# Patient Record
Sex: Female | Born: 2000 | Race: Black or African American | Hispanic: No | Marital: Single | Smoking: Never smoker
Health system: Southern US, Community
[De-identification: ages and names within clinical notes are randomized; demographics above are authoritative.]

## PROBLEM LIST (undated history)

## (undated) DIAGNOSIS — J302 Other seasonal allergic rhinitis: Secondary | ICD-10-CM

## (undated) HISTORY — PX: TONSILLECTOMY: SUR1361

## (undated) HISTORY — PX: WISDOM TOOTH EXTRACTION: SHX21

---
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
## 2022-01-04 ENCOUNTER — Emergency Department (HOSPITAL_BASED_OUTPATIENT_CLINIC_OR_DEPARTMENT_OTHER)
Admission: EM | Admit: 2022-01-04 | Discharge: 2022-01-04 | Disposition: A | Discharge status: Home / Self Care | Payer: No Typology Code available for payment source | Attending: Emergency Medicine | Admitting: Emergency Medicine

## 2022-01-04 ENCOUNTER — Encounter (HOSPITAL_BASED_OUTPATIENT_CLINIC_OR_DEPARTMENT_OTHER): Payer: Self-pay | Admitting: Urology

## 2022-01-04 ENCOUNTER — Emergency Department (HOSPITAL_BASED_OUTPATIENT_CLINIC_OR_DEPARTMENT_OTHER): Payer: No Typology Code available for payment source

## 2022-01-04 ENCOUNTER — Other Ambulatory Visit: Payer: Self-pay

## 2022-01-04 DIAGNOSIS — Y92410 Unspecified street and highway as the place of occurrence of the external cause: Secondary | ICD-10-CM | POA: Insufficient documentation

## 2022-01-04 DIAGNOSIS — M25561 Pain in right knee: Secondary | ICD-10-CM | POA: Diagnosis present

## 2022-01-04 DIAGNOSIS — M25562 Pain in left knee: Secondary | ICD-10-CM | POA: Insufficient documentation

## 2022-01-04 DIAGNOSIS — M545 Low back pain, unspecified: Secondary | ICD-10-CM | POA: Insufficient documentation

## 2022-01-04 LAB — PREGNANCY, URINE: Preg Test, Ur: NEGATIVE

## 2022-01-04 MED ORDER — NAPROXEN 500 MG PO TABS: 500.0000 mg | ORAL_TABLET | Freq: Two times a day (BID) | ORAL | 0 refills | Status: DC
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
## 2022-07-14 ENCOUNTER — Emergency Department (HOSPITAL_BASED_OUTPATIENT_CLINIC_OR_DEPARTMENT_OTHER): Payer: Medicaid Other

## 2022-07-14 ENCOUNTER — Other Ambulatory Visit: Payer: Self-pay

## 2022-07-14 ENCOUNTER — Encounter (HOSPITAL_BASED_OUTPATIENT_CLINIC_OR_DEPARTMENT_OTHER): Payer: Self-pay | Admitting: Emergency Medicine

## 2022-07-14 ENCOUNTER — Emergency Department (HOSPITAL_BASED_OUTPATIENT_CLINIC_OR_DEPARTMENT_OTHER)
Admission: EM | Admit: 2022-07-14 | Discharge: 2022-07-14 | Disposition: A | Discharge status: Home / Self Care | Payer: Medicaid Other | Attending: Emergency Medicine | Admitting: Emergency Medicine

## 2022-07-14 DIAGNOSIS — R202 Paresthesia of skin: Secondary | ICD-10-CM | POA: Diagnosis not present

## 2022-07-14 MED ORDER — METHYLPREDNISOLONE 4 MG PO TBPK: ORAL_TABLET | ORAL | 0 refills | Status: DC

## 2022-07-14 NOTE — ED Provider Notes (Signed)
[facility] EMERGENCY DEPARTMENT Provider Note   CSN: [id number] Arrival date & time: 07/14/22  1927     History  Chief Complaint  Patient presents with   Hand Problem    Christina Hampton is a 21 y.o. female.  Patient here with tingling to both hands.  Symptoms on and off for the last 2 months.  She was told that she probably has carpal tunnel.  She denies any neck pain, weakness, speech changes, vision changes.  No history of medical issues.  May be some stress and not sleeping well.  She is having some headaches now.  She has had a menstrual cycle 2 weeks ago.  She denies any pain.  Nothing makes it worse or better.  She works as a Oncologist.  Does not do a lot of manual labor or repetitive action.  Denies any trauma.  Denies any nausea, vomiting.  No neuromuscular diseases in the family as she is aware of including MS.  She does not have any chest pain or shortness of breath.  The history is provided by the patient.       Home Medications Prior to Admission medications   Medication Sig Start Date End Date Taking? Authorizing Provider  methylPREDNISolone (MEDROL DOSEPAK) 4 MG TBPK tablet Follow package insert 07/14/22  Yes Laurette Villescas, DO  cetirizine (ZYRTEC) 10 MG tablet Take 10 mg by mouth daily as needed. 07/07/19   [provider]  fluticasone Aleda Grana) 50 MCG/ACT nasal spray SMARTSIG:1 Spray(s) Both Nares Daily PRN 11/11/19   [provider]  naproxen (NAPROSYN) 500 MG tablet Take 1 tablet (500 mg total) by mouth 2 (two) times daily. 01/04/22   Henderly, Britni A, PA-C  TRI-LO-SPRINTEC 0.18/0.215/0.25 MG-25 MCG tab Take 1 tablet by mouth every morning. 11/11/19   [provider]      Allergies    Penicillins    Review of Systems   Review of Systems  Physical Exam Updated Vital Signs BP 110/79   Pulse 74   Temp 98.6 F (37 C) (Oral)   Resp 14   Ht 5\' 3"  (1.6 m)   Wt 77.1 kg   LMP 06/24/2022 (Approximate)   SpO2 100%    BMI 30.11 kg/m  Physical Exam Vitals and nursing note reviewed.  Constitutional:      General: She is not in acute distress.    Appearance: She is well-developed. She is not ill-appearing.  HENT:     Head: Normocephalic and atraumatic.     Nose: Nose normal.     Mouth/Throat:     Mouth: Mucous membranes are moist.  Eyes:     Extraocular Movements: Extraocular movements intact.     Conjunctiva/sclera: Conjunctivae normal.     Pupils: Pupils are equal, round, and reactive to light.  Cardiovascular:     Rate and Rhythm: Normal rate and regular rhythm.     Pulses: Normal pulses.     Heart sounds: Normal heart sounds. No murmur heard. Pulmonary:     Effort: Pulmonary effort is normal. No respiratory distress.     Breath sounds: Normal breath sounds.  Abdominal:     Palpations: Abdomen is soft.     Tenderness: There is no abdominal tenderness.  Musculoskeletal:        General: No swelling.     Cervical back: Normal range of motion and neck supple.  Skin:    General: Skin is warm and dry.     Capillary Refill: Capillary refill takes less  than 2 seconds.  Neurological:     General: No focal deficit present.     Mental Status: She is alert and oriented to person, place, and time.     Cranial Nerves: No cranial nerve deficit.     Sensory: No sensory deficit.     Motor: No weakness.     Coordination: Coordination normal.     Comments: 5+ out of 5 strength throughout, normal sensation, no drift, normal finger-nose-finger, normal speech, normal gait  Psychiatric:        Mood and Affect: Mood normal.     ED Results / Procedures / Treatments   Labs (all labs ordered are listed, but only abnormal results are displayed) Labs Reviewed - No data to display  EKG None  Radiology CT Head Wo Contrast  Result Date: 07/14/2022 CLINICAL DATA:  Headache, increasing frequency or severity EXAM: CT HEAD WITHOUT CONTRAST TECHNIQUE: Contiguous axial images were obtained from the base of the  skull through the vertex without intravenous contrast. RADIATION DOSE REDUCTION: This exam was performed according to the departmental dose-optimization program which includes automated exposure control, adjustment of the mA and/or kV according to patient size and/or use of iterative reconstruction technique. COMPARISON:  None Available. FINDINGS: Brain: No evidence of acute infarction, hemorrhage, hydrocephalus, extra-axial collection or mass lesion/mass effect. Vascular: No hyperdense vessel or unexpected calcification. Skull: Normal. Negative for fracture or focal lesion. Sinuses/Orbits: No acute finding. Other: None. IMPRESSION: No acute intracranial abnormality. Electronically Signed   By: Davina Poke D.O.   On: 07/14/2022 21:37    Procedures Procedures    Medications Ordered in ED Medications - No data to display  ED Course/ Medical Decision Making/ A&P                           Medical Decision Making Amount and/or Complexity of Data Reviewed Radiology: ordered.  Risk Prescription drug management.   [name] is here with hand numbness bilaterally with some headaches.  She has had numbness on and off for the last 2 months.  Was seen by primary care doctor who told her it was carpal tunnel.  She has been having some mild headaches now recently which are new.  She denies any weakness, vision changes, nausea, vomiting, gait issues.  No recent illnesses.  Overall she appears well.  Neuroexam is normal.  She has normal sensation throughout.  Overall she does admit to some anxiety.  Differential diagnosis is maybe this is a peripheral nerve issue or may be some anxiety related issue but we will get a head CT to evaluate for any obvious mass effect/mass.  I will refer her to neurology if imaging are unremarkable.  She is already had some blood work done by primary doctor was unremarkable.  Including vitamin B 12.  Will do a course of steroids to see if that helps with may be some  peripheral nerve inflammation.  She has no neck pain.  I have no concern for cervical spine issue.  Seems unlikely to be MS that she is not having any other symptoms like this before in the past.  No visual symptoms.  But will have neurology workup further.  Patient with unremarkable head CT per my review and interpretation.  Radiology report also with no acute findings.  Will have her follow-up with neurology.  Will have her follow-up with primary care doctor.  Will put her on a Medrol Dosepak for possibly peripheral nerve process.  Discharged in good condition.  This chart was dictated using voice recognition software.  Despite best efforts to proofread,  errors can occur which can change the documentation meaning.         Final Clinical Impression(s) / ED Diagnoses Final diagnoses:  Paresthesias    Rx / DC Orders ED Discharge Orders          Ordered    Ambulatory referral to Neurology       Comments: An appointment is requested in approximately: 2 weeks   07/14/22 2146    methylPREDNISolone (MEDROL DOSEPAK) 4 MG TBPK tablet        07/14/22 2146              Lennice Sites, DO 07/14/22 2147

## 2022-07-14 NOTE — ED Triage Notes (Addendum)
Pt reports bilateral hand numbness x 1.5 months, reports she has been seen by her PCP and they are unsure of the cause but suspect carpal tunnel, pt states the numbness is causing fatigue and a headache

## 2022-09-10 ENCOUNTER — Encounter: Payer: Self-pay | Admitting: Diagnostic Neuroimaging

## 2022-09-10 ENCOUNTER — Ambulatory Visit (INDEPENDENT_AMBULATORY_CARE_PROVIDER_SITE_OTHER): Payer: Medicaid Other | Admitting: Diagnostic Neuroimaging

## 2022-09-10 VITALS — BP 112/67 | HR 60 | Ht 62.0 in | Wt 176.0 lb

## 2022-09-10 DIAGNOSIS — R200 Anesthesia of skin: Secondary | ICD-10-CM | POA: Diagnosis not present

## 2022-09-10 NOTE — Progress Notes (Signed)
[facility]  PATIENT: Christina Hampton DOB: 2000-11-02  REFERRING CLINICIAN: Lennice Sites, DO HISTORY FROM: patient  REASON FOR VISIT: new consult   HISTORICAL  CHIEF COMPLAINT:  Chief Complaint  Patient presents with   New Patient (Initial Visit)    Pt alone, rm 7, presents today for concerns of bilateral arms numbness and tingling x 3 months. Denies any injury leading up to when she started having the symptoms. Went to ER 12/24 States that she has this feeling daily. She states interfering with her sleep. Bilateral lower arms are worse than upper arms but can also be the whole arm at times. States worse when she is still. She was given a steroid dose pack which helped while you were on med.     HISTORY OF PRESENT ILLNESS:   22 year old female here for evaluation of bilateral hand numbness.  Symptoms started in December 2023.  Symptoms mainly occur after when she is laying down or wakes her up from sleep.  She has numbness and tingling from her bilateral elbows to bilateral fingertips.  Right side slightly more affected than left.  All digits are involved.  No problem with face, visions, speech or swallowing.  No problems with legs, neck or lower back.  She saw PCP who recommended carpal tunnel wrist splint on right side but this did not help.   REVIEW OF SYSTEMS: Full 14 system review of systems performed and negative with exception of: as per HPI.  ALLERGIES: Allergies  Allergen Reactions   Penicillins Itching    HOME MEDICATIONS: Outpatient Medications Prior to Visit  Medication Sig Dispense Refill   cetirizine (ZYRTEC) 10 MG tablet Take 10 mg by mouth daily as needed.     fluticasone (FLONASE) 50 MCG/ACT nasal spray SMARTSIG:1 Spray(s) Both Nares Daily PRN     naproxen (NAPROSYN) 500 MG tablet Take 1 tablet (500 mg total) by mouth 2 (two) times daily. 30 tablet 0   TRI-LO-SPRINTEC 0.18/0.215/0.25 MG-25 MCG tab Take 1 tablet by mouth every morning.      methylPREDNISolone (MEDROL DOSEPAK) 4 MG TBPK tablet Follow package insert 21 each 0   No facility-administered medications prior to visit.    PAST MEDICAL HISTORY: No past medical history on file.  PAST SURGICAL HISTORY: No past surgical history on file.  FAMILY HISTORY: Family History  Problem Relation Age of Onset   Diabetes Maternal Grandmother    Breast cancer Maternal Grandmother    Breast cancer Paternal Grandmother    Diabetes Paternal Grandmother     SOCIAL HISTORY: Social History   Socioeconomic History   Marital status: Single    Spouse name: Not on file   Number of children: Not on file   Years of education: Not on file   Highest education level: Not on file  Occupational History   Not on file  Tobacco Use   Smoking status: Never   Smokeless tobacco: Never  Vaping Use   Vaping Use: Never used  Substance and Sexual Activity   Alcohol use: Never   Drug use: Never   Sexual activity: Not on file  Other Topics Concern   Not on file  Social History Narrative   Not on file   Social Determinants of Health   Financial Resource Strain: Not on file  Food Insecurity: Not on file  Transportation Needs: Not on file  Physical Activity: Not on file  Stress: Not on file  Social Connections: Not on file  Intimate Partner Violence: Not on file  PHYSICAL EXAM  GENERAL EXAM/CONSTITUTIONAL: Vitals:  Vitals:   09/10/22 0851  BP: 112/67  Pulse: 60  Weight: 176 lb (79.8 kg)  Height: 5' 2"$  (1.575 m)   Body mass index is 32.19 kg/m. Wt Readings from Last 3 Encounters:  09/10/22 176 lb (79.8 kg) (94 %, Z= 1.52)*  07/14/22 170 lb (77.1 kg) (92 %, Z= 1.40)*  01/04/22 180 lb (81.6 kg) (95 %, Z= 1.63)*   * Growth percentiles are based on CDC (Girls, 2-20 Years) data.   Patient is in no distress; well developed, nourished and groomed; neck is supple  CARDIOVASCULAR: Examination of carotid arteries is normal; no carotid bruits Regular rate and rhythm, no  murmurs Examination of peripheral vascular system by observation and palpation is normal  EYES: Ophthalmoscopic exam of optic discs and posterior segments is normal; no papilledema or hemorrhages No results found.  MUSCULOSKELETAL: Gait, strength, tone, movements noted in Neurologic exam below  NEUROLOGIC: MENTAL STATUS:      No data to display         awake, alert, oriented to person, place and time recent and remote memory intact normal attention and concentration language fluent, comprehension intact, naming intact fund of knowledge appropriate  CRANIAL NERVE:  2nd - no papilledema on fundoscopic exam 2nd, 3rd, 4th, 6th - pupils equal and reactive to light, visual fields full to confrontation, extraocular muscles intact, no nystagmus 5th - facial sensation symmetric 7th - facial strength symmetric 8th - hearing intact 9th - palate elevates symmetrically, uvula midline 11th - shoulder shrug symmetric 12th - tongue protrusion midline  MOTOR:  normal bulk and tone, full strength in the BUE, BLE; EXCEPT FINGER ABDUCTION AND FINGER FLEXION LIMITED 3-4/5 (? effort dependent vs weakness)  SENSORY:  normal and symmetric to light touch, pinprick, temperature, vibration; EXCEPT DECR PP IN FINGERTIPS (DIGITS 1-5) NEG PHALENS; NEG TINELS  COORDINATION:  finger-nose-finger, fine finger movements normal  REFLEXES:  deep tendon reflexes 1+ and symmetric  GAIT/STATION:  narrow based gait     DIAGNOSTIC DATA (LABS, IMAGING, TESTING) - I reviewed patient records, labs, notes, testing and imaging myself where available.  No results found for: "WBC", "HGB", "HCT", "MCV", "PLT" No results found for: "NA", "K", "CL", "CO2", "GLUCOSE", "BUN", "CREATININE", "CALCIUM", "PROT", "ALBUMIN", "AST", "ALT", "ALKPHOS", "BILITOT", "GFRNONAA", "GFRAA" No results found for: "CHOL", "HDL", "LDLCALC", "LDLDIRECT", "TRIG", "CHOLHDL" No results found for: "HGBA1C" No results found for:  "VITAMINB12" No results found for: "TSH"  07/14/22 CT head  - No acute intracranial abnormality.     ASSESSMENT AND PLAN  22 y.o. year old female here with:   Dx:  1. Bilateral hand numbness      PLAN:  Bilateral hand / arm numbness (ddx: peripheral neuropathy, CTS, ulnar neuropathy, cervical radiculopathies) - check EMG/NCS - if negative, then check MRI cervical spine  Return for for NCV/EMG.    Penni Bombard, MD [id number], 0000000 AM Certified in Neurology, Neurophysiology and Neuroimaging  [facility] [address], [place], [place] [address] [phone]

## 2022-10-15 ENCOUNTER — Other Ambulatory Visit: Payer: Self-pay

## 2022-10-15 ENCOUNTER — Emergency Department (HOSPITAL_BASED_OUTPATIENT_CLINIC_OR_DEPARTMENT_OTHER)
Admission: EM | Admit: 2022-10-15 | Discharge: 2022-10-15 | Disposition: A | Discharge status: Home / Self Care | Payer: Medicaid Other | Attending: Emergency Medicine | Admitting: Emergency Medicine

## 2022-10-15 ENCOUNTER — Encounter (HOSPITAL_BASED_OUTPATIENT_CLINIC_OR_DEPARTMENT_OTHER): Payer: Self-pay

## 2022-10-15 DIAGNOSIS — R21 Rash and other nonspecific skin eruption: Secondary | ICD-10-CM | POA: Insufficient documentation

## 2022-10-15 MED ORDER — DIPHENHYDRAMINE HCL 25 MG PO TABS: 25.0000 mg | ORAL_TABLET | Freq: Four times a day (QID) | ORAL | 0 refills | Status: DC

## 2022-10-15 MED ORDER — LORATADINE 10 MG PO TABS: 10.0000 mg | ORAL_TABLET | Freq: Every day | ORAL | 0 refills | Status: AC

## 2022-10-15 MED ORDER — BETAMETHASONE DIPROPIONATE 0.05 % EX OINT: TOPICAL_OINTMENT | Freq: Two times a day (BID) | CUTANEOUS | 0 refills | Status: AC

## 2022-10-15 NOTE — Discharge Instructions (Addendum)
Please take your medications as prescribed. I recommend close follow-up with PCP for reevaluation.  Please do not hesitate to return to emergency department if worrisome signs symptoms we discussed become apparent.  

## 2022-10-15 NOTE — ED Provider Notes (Addendum)
[facility] Provider Note   CSN: [id number] Arrival date & time: 10/15/22  1054     History  Chief Complaint  Patient presents with   Rash    Rash right hand    Christina Hampton is a 22 y.o. female otherwise healthy presents today for evaluation of rash.  Patient states she started to notice red bumps on her right hand 3 days ago.  States the rash spread over the back of her right hand and she started to notice some similar bumps on her left hand.  States she was some outside a lot and not sure if she has been contact with poison ivy.  Denies any new medications, soap or detergent.  Denies rashes elsewhere.  The rash is significantly itching.  Patient has tried Benadryl which she got over-the-counter 3 times a day with no relief.   Rash    History reviewed. No pertinent past medical history. History reviewed. No pertinent surgical history.   Home Medications Prior to Admission medications   Medication Sig Start Date End Date Taking? Authorizing Provider  naproxen (NAPROSYN) 500 MG tablet Take 1 tablet (500 mg total) by mouth 2 (two) times daily. 01/04/22  Yes Henderly, Britni A, PA-C  cetirizine (ZYRTEC) 10 MG tablet Take 10 mg by mouth daily as needed. 07/07/19   [provider]  fluticasone Asencion Islam) 50 MCG/ACT nasal spray SMARTSIG:1 Spray(s) Both Nares Daily PRN 11/11/19   [provider]  TRI-LO-SPRINTEC 0.18/0.215/0.25 MG-25 MCG tab Take 1 tablet by mouth every morning. 11/11/19   [provider]      Allergies    Penicillins    Review of Systems   Review of Systems  Skin:  Positive for rash.    Physical Exam Updated Vital Signs BP (!) 119/53 (BP Location: Right Arm)   Pulse (!) 58   Temp 98.6 F (37 C) (Oral)   Resp 16   Ht 5\' 1"  (1.549 m)   Wt 81.6 kg   LMP 09/10/2022 (Approximate)   SpO2 100%   BMI 34.01 kg/m  Physical Exam Vitals and nursing note reviewed.  Constitutional:       Appearance: Normal appearance.  HENT:     Head: Normocephalic and atraumatic.     Mouth/Throat:     Mouth: Mucous membranes are moist.  Eyes:     General: No scleral icterus. Cardiovascular:     Rate and Rhythm: Normal rate and regular rhythm.     Pulses: Normal pulses.     Heart sounds: Normal heart sounds.  Pulmonary:     Effort: Pulmonary effort is normal.     Breath sounds: Normal breath sounds.  Abdominal:     General: Abdomen is flat.     Palpations: Abdomen is soft.     Tenderness: There is no abdominal tenderness.  Musculoskeletal:        General: No deformity.  Skin:    General: Skin is warm.     Findings: No rash.     Comments: Red bumpy rash in the dorsal aspect of right hand and a few on the left hand.  Neurological:     General: No focal deficit present.     Mental Status: She is alert.  Psychiatric:        Mood and Affect: Mood normal.     ED Results / Procedures / Treatments   Labs (all labs ordered are listed, but only abnormal results are displayed) Labs Reviewed -  No data to display  EKG None  Radiology No results found.  Procedures Procedures    Medications Ordered in ED Medications - No data to display  ED Course/ Medical Decision Making/ A&P                             Medical Decision Making Risk OTC drugs. Prescription drug management.   This patient presents to the ED for rash, this involves an extensive number of treatment options, and is a complaint that carries with a high risk of complications and morbidity.  The differential diagnosis includes contact dermatitis, eczema, poison ivy, TEN/SJS, infectious etiology.  This is not an exhaustive list.  Problem list/ ED course/ Critical interventions/ Medical management: HPI: See above Vital signs within normal range and stable throughout visit. Laboratory/imaging studies significant for: See above. On physical examination, patient is afebrile and appears in no acute distress.  This patient who presents with rash for 3 days, consistent with eczema versus contact dermatitis versus poison ivy versus viral exanthem. History and exam findings not consistent with dangerous etiologies of rash such as SJS/TEN, or secondary dangerous causes such as petechial rashes from thrombocytopenia or rickettsial infections. Rash does not appear urticarial with no signs of anaphylaxis either. Plan at this time is to treat symptomatically with antihistamine and topical steroid, instruct to follow up with PCP or derm PRN. I have reviewed the patient home medicines and have made adjustments as needed.  Cardiac monitoring/EKG: The patient was maintained on a cardiac monitor.  I personally reviewed and interpreted the cardiac monitor which showed an underlying rhythm of: sinus rhythm.  Additional history obtained: External records from outside source obtained and reviewed including: Chart review including previous notes, labs, imaging.  Consultations obtained:  Disposition Continued outpatient therapy. Follow-up with PCP recommended for reevaluation of symptoms. Treatment plan discussed with patient.  Pt acknowledged understanding was agreeable to the plan. Worrisome signs and symptoms were discussed with patient, and patient acknowledged understanding to return to the ED if they noticed these signs and symptoms. Patient was stable upon discharge.   This chart was dictated using voice recognition software.  Despite best efforts to proofread,  errors can occur which can change the documentation meaning.          Final Clinical Impression(s) / ED Diagnoses Final diagnoses:  Rash    Rx / DC Orders ED Discharge Orders          Ordered    diphenhydrAMINE (BENADRYL) 25 MG tablet  Every 6 hours,   Status:  Discontinued        10/15/22 1136    betamethasone dipropionate (DIPROLENE) 0.05 % ointment  2 times daily        10/15/22 1136    loratadine (CLARITIN) 10 MG tablet  Daily         10/15/22 1140              Rex Kras, [place] 10/15/22 1144    Rex Kras, PA 10/15/22 1144    Malvin Johns, MD 10/15/22 1219

## 2022-10-15 NOTE — ED Triage Notes (Signed)
Pr reports rash on right hand since Friday. Is currently taking benadryl for rash. States left hand is worsening. No relief with benadryl.

## 2022-10-31 ENCOUNTER — Ambulatory Visit: Payer: Medicaid Other | Admitting: Neurology

## 2022-10-31 ENCOUNTER — Encounter: Payer: Self-pay | Admitting: Neurology

## 2022-10-31 VITALS — BP 110/66 | HR 73 | Ht 62.0 in | Wt 176.0 lb

## 2022-10-31 DIAGNOSIS — G5603 Carpal tunnel syndrome, bilateral upper limbs: Secondary | ICD-10-CM

## 2022-10-31 DIAGNOSIS — R200 Anesthesia of skin: Secondary | ICD-10-CM

## 2022-10-31 NOTE — Patient Instructions (Addendum)
Carpal Tunnel Syndrome  Carpal tunnel syndrome is a condition that causes pain, numbness, and weakness in your hand and fingers. The carpal tunnel is a narrow area located on the palm side of your wrist. Repeated wrist motion or certain diseases may cause swelling within the tunnel. This swelling pinches the main nerve in the wrist. The main nerve in the wrist is called the median nerve.   What are the causes? This condition may be caused by: Repeated and forceful wrist and hand motions. Wrist injuries. Arthritis. A cyst or tumor in the carpal tunnel. Fluid buildup during pregnancy. Use of tools that vibrate. Sometimes the cause of this condition is not known. What increases the risk? The following factors may make you more likely to develop this condition: Having a job that requires you to repeatedly or forcefully move your wrist or hand or requires you to use tools that vibrate. This may include jobs that involve using computers, working on an [employer]assembly line, or working with power tools such as Radiographer, therapeuticdrills or sanders. Being a woman. Having certain conditions, such as: Diabetes. Obesity. An underactive thyroid (hypothyroidism). Kidney failure. Rheumatoid arthritis. What are the signs or symptoms? Symptoms of this condition include: A tingling feeling in your fingers, especially in your thumb, index, and middle fingers. Tingling or numbness in your hand. An aching feeling in your entire arm, especially when your wrist and elbow are bent for a long time. Wrist pain that goes up your arm to your shoulder. Pain that goes down into your palm or fingers. A weak feeling in your hands. You may have trouble grabbing and holding items. Your symptoms may feel worse during the night. How is this diagnosed? This condition is diagnosed with a medical history and physical exam. You may also have tests, including: Electromyogram (EMG). This test measures electrical signals sent by your nerves into  the muscles. Nerve conduction study. This test measures how well electrical signals pass through your nerves. Imaging tests, such as X-rays, ultrasound, and MRI. These tests check for possible causes of your condition. How is this treated? This condition may be treated with: Lifestyle changes. It is important to stop or change the activity that caused your condition. Doing exercise and activities to strengthen and stretch your muscles and tendons (physical therapy). Making lifestyle changes to help with your condition and learning how to do your daily activities safely (occupational therapy). Medicines for pain and inflammation. This may include medicine that is injected into your wrist. A wrist splint or brace. Surgery. Follow these instructions at home: If you have a splint or brace: Wear the splint or brace as told by your health care provider. Remove it only as told by your health care provider. Loosen the splint or brace if your fingers tingle, become numb, or turn cold and blue. Keep the splint or brace clean. If the splint or brace is not waterproof: Do not let it get wet. Cover it with a watertight covering when you take a bath or shower. Managing pain, stiffness, and swelling If directed, put ice on the painful area. To do this: If you have a removeable splint or brace, remove it as told by your health care provider. Put ice in a plastic bag. Place a towel between your skin and the bag or between the splint or brace and the bag. Leave the ice on for 20 minutes, 2-3 times a day. Do not fall asleep with the cold pack on your skin. Remove the ice if  your skin turns bright red. This is very important. If you cannot feel pain, heat, or cold, you have a greater risk of damage to the area. Move your fingers often to reduce stiffness and swelling. General instructions Take over-the-counter and prescription medicines only as told by your health care provider. Rest your wrist and hand  from any activity that may be causing your pain. If your condition is work related, talk with your employer about changes that can be made, such as getting a wrist pad to use while typing. Do any exercises as told by your health care provider, physical therapist, or occupational therapist. Keep all follow-up visits. This is important. Contact a health care provider if: You have new symptoms. Your pain is not controlled with medicines. Your symptoms get worse. Get help right away if: You have severe numbness or tingling in your wrist or hand. Summary Carpal tunnel syndrome is a condition that causes pain, numbness, and weakness in your hand and fingers. It is usually caused by repeated wrist motions. Lifestyle changes and medicines are used to treat carpal tunnel syndrome. Surgery may be recommended. Follow your health care provider's instructions about wearing a splint, resting from activity, keeping follow-up visits, and calling for help. This information is not intended to replace advice given to you by your health care provider. Make sure you discuss any questions you have with your health care provider.  Ulnar neuropathy  Cubital tunnel syndrome is a condition that causes pain and weakness of the forearm and hand. It happens when one of the nerves that runs along the inside of the elbow joint (ulnar nerve) becomes irritated. This condition is usually caused by repeated arm motions that are done during sports or work-related activities. What are the causes?   This condition may be caused by: Increased pressure on the ulnar nerve at the elbow, arm, or forearm. This can result from: Irritation caused by repeated elbow bending. Poorly healed elbow fractures. Tumors in the elbow. These are usually noncancerous (benign). Scar tissue that develops in the elbow after an injury. Bony growths (spurs) near the ulnar nerve. Stretching of the nerve due to loose elbow ligaments. Trauma to the  nerve at the elbow. What increases the risk? The following factors may make you more likely to develop this condition: Doing manual labor that requires frequent bending of the elbow. Playing sports that include repeated or strenuous throwing motions, such as baseball. Playing contact sports, such as football or lacrosse. Not warming up properly before activities. Having diabetes. Having an underactive thyroid (hypothyroidism). What are the signs or symptoms? Symptoms of this condition include: Clumsiness or weakness of the hand. Tenderness of the inner elbow. Aching or soreness of the inner elbow, forearm, or fingers, especially the little finger or the ring finger. Increased pain when forcing the elbow to bend. Reduced control when throwing objects. Tingling, numbness, or a burning feeling inside the forearm or in part of the hand or fingers, especially the little finger or the ring finger. Sharp pains that shoot from the elbow down to the wrist and hand. The inability to grip or pinch hard. How is this diagnosed? This condition is diagnosed based on: Your symptoms and medical history. Your health care provider will also ask for details about any injury. A physical exam. You may also have tests, including: Electromyogram (EMG). This test measures electrical signals sent by your nerves into the muscles. Nerve conduction study. This test measures how well electrical signals pass through your nerves.  Imaging tests, such as X-rays, ultrasound, and MRI. These tests check for possible causes of your condition. How is this treated? This condition may be treated by: Stopping the activities that are causing your symptoms to get worse. Icing and taking medicines to reduce pain and swelling. Wearing a splint to prevent your elbow from bending, or wearing an elbow pad where the ulnar nerve is closest to the skin. Working with a physical therapist in less severe cases. This may help to: Decrease  your symptoms. Improve the strength and range of motion of your elbow, forearm, and hand. If these treatments do not help, surgery may be needed. Follow these instructions at home: If you have a splint: Wear the splint as told by your health care provider. Remove it only as told by your health care provider. Loosen the splint if your fingers tingle, become numb, or turn cold and blue. Keep the splint clean. If the splint is not waterproof: Do not let it get wet. Cover it with a watertight covering when you take a bath or shower. Managing pain, stiffness, and swelling  If directed, put ice on the injured area: Put ice in a plastic bag. Place a towel between your skin and the bag. Leave the ice on for 20 minutes, 2-3 times a day. Move your fingers often to avoid stiffness and to lessen swelling. Raise (elevate) the injured area above the level of your heart while you are sitting or lying down. General instructions Take over-the-counter and prescription medicines only as told by your health care provider. Do any exercise or physical therapy as told by your health care provider. Do not drive or use heavy machinery while taking prescription pain medicine. If you were given an elbow pad, wear it as told by your health care provider. Keep all follow-up visits as told by your health care provider. This is important. Contact a health care provider if: Your symptoms get worse. Your symptoms do not get better with treatment. You have new pain. Your hand on the injured side feels numb or cold. Summary Cubital tunnel syndrome is a condition that causes pain and weakness of the forearm and hand. You are more likely to develop this condition if you do work or play sports that involve repeated arm movements. This condition is often treated by stopping repetitive activities, applying ice, and using anti-inflammatory medicines. In rare cases, surgery may be needed. This information is not intended  to replace advice given to you by your health care provider. Make sure you discuss any questions you have with your health care provider. Document Revised: 08/29/2020 Document Reviewed: 08/30/2020 Elsevier Patient Education  2023 [employer].

## 2022-10-31 NOTE — Progress Notes (Unsigned)
Full Name: Christina Hampton Gender: Female MRN #: [id number] Date of Birth: 2001/04/15    Visit Date: 10/31/2022 07:53 Age: 22 Years Examining Physician: Dr. Naomie Dean Referring Physician: Dr. Joycelyn Schmid Height: 5 feet 2 inch  History: Numbness in the hands  Summary:  Nerve Conduction Studies were performed on the bilateral upper extremities.  The right median APB motor nerve showed prolonged distal onset latency (*** ms, N<4.4) and reduced amplitude(*** mV, N<4) The left  median APB motor nerve showed prolonged distal onset latency (*** ms, N<4.4) and reduced amplitude(*** mV, N<4) The right Median 2nd Digit orthodromic sensory nerve showed prolonged distal peak latency (**** ms, N<3.4) and reduced amplitude(*** uV, N>10) The left  Median 2nd Digit orthodromic sensory nerve showed prolonged distal peak latency (**** ms, N<3.4) and reduced amplitude(*** uV, N>10)  F Wave studies indicate that the right Median F wave has delayed latency(***, N<85ms).  The right median/ulnar (palm) comparison nerve showed prolonged distal peak latency (Median Palm, 3.1 ms, N<2.2) and abnormal peak latency difference (Median Palm-Ulnar Palm, 1.1 ms, N<0.4) with a relative median delay.    The left median/ulnar (palm) comparison nerve showed prolonged distal peak latency (Median Palm, 2.8 ms, N<2.2) and abnormal peak latency difference (Median Palm-Ulnar Palm, 1.1 ms, N<0.4) with a relative median delay.     Conclusion: This is an abnormal study. 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  No suggestion of polyneuropathy  or radiculopathy.       Conclusion:     Dr. Naomie Dean, M.D.  [facility] [address] [place], [place] [address] Tel: [phone] Fax: [phone]  Verbal informed consent was obtained from the patient, patient was informed of potential risk of procedure, including  bruising, bleeding, hematoma formation, infection, muscle weakness, muscle pain, numbness, among others.        MNC    Nerve / Sites Muscle Latency Ref. Amplitude Ref. Rel Amp Segments Distance Velocity Ref. Area    ms ms mV mV %  cm m/s m/s mVms  R Median - APB     Wrist APB 4.2 ?4.4 8.8 ?4.0 100 Wrist - APB 7   36.1     Upper arm APB 8.8  8.3  93.5 Upper arm - Wrist 25 55 ?49 35.4  L Median - APB     Wrist APB 4.3 ?4.4 6.9 ?4.0 100 Wrist - APB 7   26.5     Upper arm APB 9.0  7.7  112 Upper arm - Wrist 26 55 ?49 29.0  R Ulnar - ADM     Wrist ADM 2.5 ?3.3 8.1 ?6.0 100 Wrist - ADM 7   33.0     B.Elbow ADM 4.9  8.3  102 B.Elbow - Wrist 16 67 ?49 32.1     A.Elbow ADM 7.6  8.1  98.2 A.Elbow - B.Elbow 17 62 ?49 33.8  L Ulnar - ADM     Wrist ADM 2.4 ?3.3 7.8 ?6.0 100 Wrist - ADM 7   31.1     B.Elbow ADM 4.3  7.8  100 B.Elbow - Wrist 12 65 ?49 30.9     A.Elbow ADM 7.1  6.1  78.7 A.Elbow - B.Elbow 17 60 ?49 26.2     Median Wrist ADM 7.2  1.5  24.7 Median Wrist - ADM    6.0  L Ulnar - FDI     Wrist FDI 3.4 ?4.5 9.2 ?  7.0 100 Wrist - FDI 8   20.9     B.Elbow FDI 5.7  8.2  89.6 B.Elbow - Wrist   ?49 18.6     A.Elbow FDI 8.1  4.2  50.5 A.Elbow - B.Elbow 12 49 ?49 9.6     median above elbow FDI 6.4  4.9  118 median above elbow - A.Elbow 15 87  12.9     median below elbow FDI 4.1  2.5  50.7 median below elbow - median above elbow    6.3         A.Elbow - Wrist      R Ulnar - FDI     Wrist FDI 2.7 ?4.5 9.1 ?7.0 100 Wrist - FDI 8   24.7     B.Elbow FDI 5.5  9.7  107 B.Elbow - Wrist 15 53 ?49 22.5     A.Elbow FDI 8.0  10.8  111 A.Elbow - B.Elbow 17 67 ?49 23.5     Axilla FDI      Axilla - A.Elbow             A.Elbow - Wrist                     SNC    Nerve / Sites Rec. Site Peak Lat Ref.  Amp Ref. Segments Distance Peak Diff Ref.    ms ms V V  cm ms ms  R Median, Ulnar - Transcarpal comparison     Median Palm Wrist 3.5 ?2.2 4 ?35 Median Palm - Wrist 8       Ulnar Palm Wrist 1.5 ?2.2 17 ?12  Ulnar Palm - Wrist 8          Median Palm - Ulnar Palm  2.0 ?0.4  L Median, Ulnar - Transcarpal comparison     Median Palm Wrist 2.6 ?2.2 4 ?35 Median Palm - Wrist 8       Ulnar Palm Wrist 1.5 ?2.2 14 ?12 Ulnar Palm - Wrist 8          Median Palm - Ulnar Palm  1.0 ?0.4  R Median - Orthodromic (Dig II, Mid palm)     Dig II Wrist 4.3 ?3.4 4 ?10 Dig II - Wrist 13    L Median - Orthodromic (Dig II, Mid palm)     Dig II Wrist 3.7 ?3.4 10 ?10 Dig II - Wrist 13    R Ulnar - Orthodromic, (Dig V, Mid palm)     Dig V Wrist 2.7 ?3.1 16 ?5 Dig V - Wrist 11    L Ulnar - Orthodromic, (Dig V, Mid palm)     Dig V Wrist 2.6 ?3.1 13 ?5 Dig V - Wrist 11    R Dorsal ulnar cutaneous - Hand dorsum (Forearm)     Forearm Hand dorsum 2.4 ?2.5 16 ?8 Forearm - Hand dorsum 8    L Dorsal ulnar cutaneous - Hand dorsum (Forearm)     Forearm Hand dorsum 2.4 ?2.5 26 ?8 Forearm - Hand dorsum 8                       F  Wave    Nerve F Lat Ref.   ms ms  R Ulnar - ADM 25.9 ?32.0  L Ulnar - ADM 25.1 ?32.0         EMG Summary Table    Spontaneous MUAP Recruitment  Muscle IA Fib PSW Fasc Other Amp Dur. Poly Pattern  L. Cervical paraspinals (low) Normal None None None _______ Normal Normal Normal Normal  R. Cervical paraspinals (low) Normal None None None _______ Normal Normal Normal Normal  L. Deltoid Normal None None None _______ Normal Normal Normal Normal  R. Deltoid Normal None None None _______ Normal Normal Normal Normal  L. Triceps brachii Normal None None None _______ Normal Normal Normal Normal  R. Triceps brachii Normal None None None _______ Normal Normal Normal Normal  L. Flexor digitorum profundus (Ulnar) Normal None None None _______ Normal Normal Normal Normal  R. Flexor digitorum profundus (Ulnar) Normal None None None _______ Normal Normal Normal Normal  L. Pronator teres Normal None None None _______ Normal Normal Normal Normal  R. Pronator teres Normal None None None _______ Normal Normal Normal  Normal  L. Extensor indicis proprius Normal None None None _______ Normal Normal Normal Normal  R. Extensor indicis proprius Normal None None None _______ Normal Normal Normal Normal  L. First dorsal interosseous Normal None None None _______ Normal Normal Normal Normal  R. First dorsal interosseous Normal None None None _______ Normal Normal Normal Normal  L. Opponens pollicis Normal None None None _______ Normal Normal Normal Normal  R. Opponens pollicis Normal None None None _______ Normal Normal Normal Normal

## 2022-11-05 NOTE — Procedures (Signed)
Full Name: Christina Hampton Gender: Female MRN #: [id number] Date of Birth: 2001-05-27    Visit Date: 10/31/2022 07:53 Age: 22 Years Examining Physician: Dr. Naomie Dean Referring Physician: Dr. Joycelyn Schmid Height: 5 feet 2 inch  History: Numbness in the hands  Summary:  Nerve Conduction Studies were performed on the bilateral upper extremities.  The right Median 2nd Digit orthodromic sensory nerve showed prolonged distal peak latency (4.3 ms, N<3.4) and reduced amplitude(4 uV, N>10) The left  Median 2nd Digit orthodromic sensory nerve showed prolonged distal peak latency (3.7 ms, N<3.4) and borderline amplitude(10 uV, N>10)  The right median/ulnar (palm) comparison nerve showed prolonged distal peak latency (Median Palm, 3.6ms, N<2.2) and abnormal peak latency difference (Median Palm-Ulnar Palm, 2.0 ms, N<0.4) with a relative median delay.    The left median/ulnar (palm) comparison nerve showed prolonged distal peak latency (Median Palm, 2.36ms, N<2.2) and abnormal peak latency difference (Median Palm-Ulnar Palm, 1.0 ms, N<0.4) with a relative median delay.    All remaining nerves (as indicated in the following tables) were within normal limits. All muscles (as indicated in the following tables) were within normal limits.     Conclusion:  There is electrophysiologic evidence of bilateral mild right > left Carpal Tunnel Syndrome.  No suggestion of polyneuropathy  or radiculopathy.   Dr. Naomie Dean, M.D.  [facility] [address] [place], [place] [address] Tel: [phone] Fax: [phone]  Verbal informed consent was obtained from the patient, patient was informed of potential risk of procedure, including bruising, bleeding, hematoma formation, infection, muscle weakness, muscle pain, numbness, among others.        MNC    Nerve / Sites Muscle Latency Ref. Amplitude Ref. Rel Amp Segments Distance Velocity Ref. Area    ms ms mV mV %  cm  m/s m/s mVms  R Median - APB     Wrist APB 4.2 ?4.4 8.8 ?4.0 100 Wrist - APB 7   36.1     Upper arm APB 8.8  8.3  93.5 Upper arm - Wrist 25 55 ?49 35.4  L Median - APB     Wrist APB 4.3 ?4.4 6.9 ?4.0 100 Wrist - APB 7   26.5     Upper arm APB 9.0  7.7  112 Upper arm - Wrist 26 55 ?49 29.0  R Ulnar - ADM     Wrist ADM 2.5 ?3.3 8.1 ?6.0 100 Wrist - ADM 7   33.0     B.Elbow ADM 4.9  8.3  102 B.Elbow - Wrist 16 67 ?49 32.1     A.Elbow ADM 7.6  8.1  98.2 A.Elbow - B.Elbow 17 62 ?49 33.8  L Ulnar - ADM     Wrist ADM 2.4 ?3.3 7.8 ?6.0 100 Wrist - ADM 7   31.1     B.Elbow ADM 4.3  7.8  100 B.Elbow - Wrist 12 65 ?49 30.9     A.Elbow ADM 7.1  7.6  78.7 A.Elbow - B.Elbow 17 60 ?49 26.2                 SNC    Nerve / Sites Rec. Site Peak Lat Ref.  Amp Ref. Segments Distance Peak Diff Ref.    ms ms V V  cm ms ms  R Median, Ulnar - Transcarpal comparison     Median Palm Wrist 3.5 ?2.2 4 ?35 Median Palm - Wrist 8  Ulnar Palm Wrist 1.5 ?2.2 17 ?12 Ulnar Palm - Wrist 8          Median Palm - Ulnar Palm  2.0 ?0.4  L Median, Ulnar - Transcarpal comparison     Median Palm Wrist 2.6 ?2.2 4 ?35 Median Palm - Wrist 8       Ulnar Palm Wrist 1.5 ?2.2 14 ?12 Ulnar Palm - Wrist 8          Median Palm - Ulnar Palm  1.0 ?0.4  R Median - Orthodromic (Dig II, Mid palm)     Dig II Wrist 4.3 ?3.4 4 ?10 Dig II - Wrist 13    L Median - Orthodromic (Dig II, Mid palm)     Dig II Wrist 3.7 ?3.4 10 ?10 Dig II - Wrist 13    R Ulnar - Orthodromic, (Dig V, Mid palm)     Dig V Wrist 2.7 ?3.1 16 ?5 Dig V - Wrist 11    L Ulnar - Orthodromic, (Dig V, Mid palm)     Dig V Wrist 2.6 ?3.1 13 ?5 Dig V - Wrist 11    R Dorsal ulnar cutaneous - Hand dorsum (Forearm)     Forearm Hand dorsum 2.4 ?2.5 16 ?8 Forearm - Hand dorsum 8    L Dorsal ulnar cutaneous - Hand dorsum (Forearm)     Forearm Hand dorsum 2.4 ?2.5 26 ?8 Forearm - Hand dorsum 8                       F  Wave    Nerve F Lat Ref.   ms ms  R Ulnar - ADM 25.9  ?32.0  L Ulnar - ADM 25.1 ?32.0         EMG Summary Table    Spontaneous MUAP Recruitment  Muscle IA Fib PSW Fasc Other Amp Dur. Poly Pattern  L. Cervical paraspinals (low) Normal None None None _______ Normal Normal Normal Normal  R. Cervical paraspinals (low) Normal None None None _______ Normal Normal Normal Normal  L. Deltoid Normal None None None _______ Normal Normal Normal Normal  R. Deltoid Normal None None None _______ Normal Normal Normal Normal  L. Triceps brachii Normal None None None _______ Normal Normal Normal Normal  R. Triceps brachii Normal None None None _______ Normal Normal Normal Normal  L. Flexor digitorum profundus (Ulnar) Normal None None None _______ Normal Normal Normal Normal  R. Flexor digitorum profundus (Ulnar) Normal None None None _______ Normal Normal Normal Normal  L. Pronator teres Normal None None None _______ Normal Normal Normal Normal  R. Pronator teres Normal None None None _______ Normal Normal Normal Normal  L. Extensor indicis proprius Normal None None None _______ Normal Normal Normal Normal  R. Extensor indicis proprius Normal None None None _______ Normal Normal Normal Normal  L. First dorsal interosseous Normal None None None _______ Normal Normal Normal Normal  R. First dorsal interosseous Normal None None None _______ Normal Normal Normal Normal  L. Opponens pollicis Normal None None None _______ Normal Normal Normal Normal  R. Opponens pollicis Normal None None None _______ Normal Normal Normal Normal

## 2022-11-05 NOTE — Progress Notes (Signed)
Patient here for numbness in the fingers. +Mcphalen's maneuver ant wrists. I explained the results of the emg/ncs with patient:  Conclusion:  There is electrophysiologic evidence of bilateral mild right > left Carpal Tunnel Syndrome.  No suggestion of polyneuropathy  or radiculopathy.   We discussed Carpal Tunnel syndrome as below and advised wrist splints. She did want to go to the hand center for consideration of other options such as injections.  I showed patient the diagrams below, where the entrapment occurs in carpal tunnel syndrome, what the causes are, signs and symptoms, and how it is treated.  Despite the EMG nerve conduction study not fitting criteria for ulnar neuropathy, patient insisted that her pinky was also numb, and there was a small drop across the elbows in conduction velocity.   So we did also discuss ulnar neuropathy, the causes, and conservative measures just in case she has a subclinical ulnar neuropathy as well.  Clinical correlation suggested.  I spent 15 minutes of face-to-face and non-face-to-face time with patient on the  1. Bilateral carpal tunnel syndrome   2. Bilateral hand numbness    diagnosis.  This included previsit chart review, lab review, study review, order entry, electronic health record documentation, patient education on the different diagnostic and therapeutic options, counseling and coordination of care, risks and benefits of management, compliance, or risk factor reduction.  This does not include time spent on EMG nerve conduction study.  Orders Placed This Encounter  Procedures   Ambulatory referral to Hand Surgery     Carpal Tunnel Syndrome  Carpal tunnel syndrome is a condition that causes pain, numbness, and weakness in your hand and fingers. The carpal tunnel is a narrow area located on the palm side of your wrist. Repeated wrist motion or certain diseases may cause swelling within the tunnel. This swelling pinches the main nerve in the wrist. The  main nerve in the wrist is called the median nerve.   What are the causes? This condition may be caused by: Repeated and forceful wrist and hand motions. Wrist injuries. Arthritis. A cyst or tumor in the carpal tunnel. Fluid buildup during pregnancy. Use of tools that vibrate. Sometimes the cause of this condition is not known. What increases the risk? The following factors may make you more likely to develop this condition: Having a job that requires you to repeatedly or forcefully move your wrist or hand or requires you to use tools that vibrate. This may include jobs that involve using computers, working on an [employer], or working with power tools such as Radiographer, therapeutic. Being a woman. Having certain conditions, such as: Diabetes. Obesity. An underactive thyroid (hypothyroidism). Kidney failure. Rheumatoid arthritis. What are the signs or symptoms? Symptoms of this condition include: A tingling feeling in your fingers, especially in your thumb, index, and middle fingers. Tingling or numbness in your hand. An aching feeling in your entire arm, especially when your wrist and elbow are bent for a long time. Wrist pain that goes up your arm to your shoulder. Pain that goes down into your palm or fingers. A weak feeling in your hands. You may have trouble grabbing and holding items. Your symptoms may feel worse during the night. How is this diagnosed? This condition is diagnosed with a medical history and physical exam. You may also have tests, including: Electromyogram (EMG). This test measures electrical signals sent by your nerves into the muscles. Nerve conduction study. This test measures how well electrical signals pass through your nerves.  Imaging tests, such as X-rays, ultrasound, and MRI. These tests check for possible causes of your condition. How is this treated? This condition may be treated with: Lifestyle changes. It is important to stop or change the  activity that caused your condition. Doing exercise and activities to strengthen and stretch your muscles and tendons (physical therapy). Making lifestyle changes to help with your condition and learning how to do your daily activities safely (occupational therapy). Medicines for pain and inflammation. This may include medicine that is injected into your wrist. A wrist splint or brace. Surgery. Follow these instructions at home: If you have a splint or brace: Wear the splint or brace as told by your health care provider. Remove it only as told by your health care provider. Loosen the splint or brace if your fingers tingle, become numb, or turn cold and blue. Keep the splint or brace clean. If the splint or brace is not waterproof: Do not let it get wet. Cover it with a watertight covering when you take a bath or shower. Managing pain, stiffness, and swelling If directed, put ice on the painful area. To do this: If you have a removeable splint or brace, remove it as told by your health care provider. Put ice in a plastic bag. Place a towel between your skin and the bag or between the splint or brace and the bag. Leave the ice on for 20 minutes, 2-3 times a day. Do not fall asleep with the cold pack on your skin. Remove the ice if your skin turns bright red. This is very important. If you cannot feel pain, heat, or cold, you have a greater risk of damage to the area. Move your fingers often to reduce stiffness and swelling. General instructions Take over-the-counter and prescription medicines only as told by your health care provider. Rest your wrist and hand from any activity that may be causing your pain. If your condition is work related, talk with your employer about changes that can be made, such as getting a wrist pad to use while typing. Do any exercises as told by your health care provider, physical therapist, or occupational therapist. Keep all follow-up visits. This is  important. Contact a health care provider if: You have new symptoms. Your pain is not controlled with medicines. Your symptoms get worse. Get help right away if: You have severe numbness or tingling in your wrist or hand. Summary Carpal tunnel syndrome is a condition that causes pain, numbness, and weakness in your hand and fingers. It is usually caused by repeated wrist motions. Lifestyle changes and medicines are used to treat carpal tunnel syndrome. Surgery may be recommended. Follow your health care provider's instructions about wearing a splint, resting from activity, keeping follow-up visits, and calling for help. This information is not intended to replace advice given to you by your health care provider. Make sure you discuss any questions you have with your health care provider.  Ulnar neuropathy  Cubital tunnel syndrome is a condition that causes pain and weakness of the forearm and hand. It happens when one of the nerves that runs along the inside of the elbow joint (ulnar nerve) becomes irritated. This condition is usually caused by repeated arm motions that are done during sports or work-related activities. What are the causes?   This condition may be caused by: Increased pressure on the ulnar nerve at the elbow, arm, or forearm. This can result from: Irritation caused by repeated elbow bending. Poorly healed elbow fractures. Tumors  in the elbow. These are usually noncancerous (benign). Scar tissue that develops in the elbow after an injury. Bony growths (spurs) near the ulnar nerve. Stretching of the nerve due to loose elbow ligaments. Trauma to the nerve at the elbow. What increases the risk? The following factors may make you more likely to develop this condition: Doing manual labor that requires frequent bending of the elbow. Playing sports that include repeated or strenuous throwing motions, such as baseball. Playing contact sports, such as football or  lacrosse. Not warming up properly before activities. Having diabetes. Having an underactive thyroid (hypothyroidism). What are the signs or symptoms? Symptoms of this condition include: Clumsiness or weakness of the hand. Tenderness of the inner elbow. Aching or soreness of the inner elbow, forearm, or fingers, especially the little finger or the ring finger. Increased pain when forcing the elbow to bend. Reduced control when throwing objects. Tingling, numbness, or a burning feeling inside the forearm or in part of the hand or fingers, especially the little finger or the ring finger. Sharp pains that shoot from the elbow down to the wrist and hand. The inability to grip or pinch hard. How is this diagnosed? This condition is diagnosed based on: Your symptoms and medical history. Your health care provider will also ask for details about any injury. A physical exam. You may also have tests, including: Electromyogram (EMG). This test measures electrical signals sent by your nerves into the muscles. Nerve conduction study. This test measures how well electrical signals pass through your nerves. Imaging tests, such as X-rays, ultrasound, and MRI. These tests check for possible causes of your condition. How is this treated? This condition may be treated by: Stopping the activities that are causing your symptoms to get worse. Icing and taking medicines to reduce pain and swelling. Wearing a splint to prevent your elbow from bending, or wearing an elbow pad where the ulnar nerve is closest to the skin. Working with a physical therapist in less severe cases. This may help to: Decrease your symptoms. Improve the strength and range of motion of your elbow, forearm, and hand. If these treatments do not help, surgery may be needed. Follow these instructions at home: If you have a splint: Wear the splint as told by your health care provider. Remove it only as told by your health care  provider. Loosen the splint if your fingers tingle, become numb, or turn cold and blue. Keep the splint clean. If the splint is not waterproof: Do not let it get wet. Cover it with a watertight covering when you take a bath or shower. Managing pain, stiffness, and swelling  If directed, put ice on the injured area: Put ice in a plastic bag. Place a towel between your skin and the bag. Leave the ice on for 20 minutes, 2-3 times a day. Move your fingers often to avoid stiffness and to lessen swelling. Raise (elevate) the injured area above the level of your heart while you are sitting or lying down. General instructions Take over-the-counter and prescription medicines only as told by your health care provider. Do any exercise or physical therapy as told by your health care provider. Do not drive or use heavy machinery while taking prescription pain medicine. If you were given an elbow pad, wear it as told by your health care provider. Keep all follow-up visits as told by your health care provider. This is important. Contact a health care provider if: Your symptoms get worse. Your symptoms do not get  better with treatment. You have new pain. Your hand on the injured side feels numb or cold. Summary Cubital tunnel syndrome is a condition that causes pain and weakness of the forearm and hand. You are more likely to develop this condition if you do work or play sports that involve repeated arm movements. This condition is often treated by stopping repetitive activities, applying ice, and using anti-inflammatory medicines. In rare cases, surgery may be needed. This information is not intended to replace advice given to you by your health care provider. Make sure you discuss any questions you have with your health care provider. Document Revised: 08/29/2020 Document Reviewed: 08/30/2020 Elsevier Patient Education  2023 [employer].

## 2022-11-06 ENCOUNTER — Telehealth: Payer: Self-pay | Admitting: Neurology

## 2022-11-06 NOTE — Telephone Encounter (Signed)
Referral sent to the [facility] [place], phone # [phone]. 

## 2022-11-19 ENCOUNTER — Emergency Department (HOSPITAL_BASED_OUTPATIENT_CLINIC_OR_DEPARTMENT_OTHER)
Admission: EM | Admit: 2022-11-19 | Discharge: 2022-11-19 | Disposition: A | Discharge status: Home / Self Care | Payer: Medicaid Other | Attending: Emergency Medicine | Admitting: Emergency Medicine

## 2022-11-19 ENCOUNTER — Encounter (HOSPITAL_BASED_OUTPATIENT_CLINIC_OR_DEPARTMENT_OTHER): Payer: Self-pay | Admitting: Emergency Medicine

## 2022-11-19 DIAGNOSIS — T7840XA Allergy, unspecified, initial encounter: Secondary | ICD-10-CM

## 2022-11-19 DIAGNOSIS — R600 Localized edema: Secondary | ICD-10-CM | POA: Diagnosis not present

## 2022-11-19 DIAGNOSIS — R21 Rash and other nonspecific skin eruption: Secondary | ICD-10-CM | POA: Diagnosis present

## 2022-11-19 DIAGNOSIS — L237 Allergic contact dermatitis due to plants, except food: Secondary | ICD-10-CM | POA: Insufficient documentation

## 2022-11-19 MED ORDER — EPINEPHRINE 0.3 MG/0.3ML IJ SOAJ: 0.3000 mg | INTRAMUSCULAR | 0 refills | Status: AC | PRN

## 2022-11-19 MED ORDER — PREDNISONE 10 MG PO TABS: ORAL_TABLET | ORAL | 0 refills | Status: AC

## 2022-11-19 MED ORDER — HYDROXYZINE HCL 25 MG PO TABS: 25.0000 mg | ORAL_TABLET | Freq: Four times a day (QID) | ORAL | 0 refills | Status: AC | PRN

## 2022-11-19 NOTE — ED Provider Notes (Signed)
  [place] [facility] Provider Note   CSN: [id number] Arrival date & time: 11/19/22  4098     History {Add pertinent medical, surgical, social history, OB history to HPI:1} Chief Complaint  Patient presents with   Allergic Reaction    Christina Hampton is a 22 y.o. female.  HPI     Rash right hand from doing yard work poison ivy contact Took benadryl, started to improve, and then slept on face, has swelling on face under eyes No difficulty swallowing, no difficulty breathing Upper lip mild swelling    History reviewed. No pertinent past medical history.   Home Medications Prior to Admission medications   Medication Sig Start Date End Date Taking? Authorizing Provider  betamethasone dipropionate (DIPROLENE) 0.05 % ointment Apply topically 2 (two) times daily. 10/15/22   Jeanelle Malling, PA  cetirizine (ZYRTEC) 10 MG tablet Take 10 mg by mouth daily as needed. 07/07/19   [provider]  fluticasone Aleda Grana) 50 MCG/ACT nasal spray SMARTSIG:1 Spray(s) Both Nares Daily PRN 11/11/19   [provider]  loratadine (CLARITIN) 10 MG tablet Take 1 tablet (10 mg total) by mouth daily. 10/15/22   Jeanelle Malling, PA  naproxen (NAPROSYN) 500 MG tablet Take 1 tablet (500 mg total) by mouth 2 (two) times daily. 01/04/22   Henderly, Britni A, PA-C  TRI-LO-SPRINTEC 0.18/0.215/0.25 MG-25 MCG tab Take 1 tablet by mouth every morning. 11/11/19   [provider]      Allergies    Penicillins    Review of Systems   Review of Systems  Physical Exam Updated Vital Signs BP 128/70 (BP Location: Right Arm)   Pulse 72   Temp 97.8 F (36.6 C) (Oral)   Resp 18   Ht 5\' 2"  (1.575 m)   Wt 80 kg   LMP 10/24/2022   SpO2 100%   BMI 32.26 kg/m  Physical Exam  ED Results / Procedures / Treatments   Labs (all labs ordered are listed, but only abnormal results are displayed) Labs Reviewed - No data to display  EKG None  Radiology No results  found.  Procedures Procedures  {Document cardiac monitor, telemetry assessment procedure when appropriate:1}  Medications Ordered in ED Medications - No data to display  ED Course/ Medical Decision Making/ A&P   {   Click here for ABCD2, HEART and other calculatorsREFRESH Note before signing :1}                          Medical Decision Making  ***  {Document critical care time when appropriate:1} {Document review of labs and clinical decision tools ie heart score, Chads2Vasc2 etc:1}  {Document your independent review of radiology images, and any outside records:1} {Document your discussion with family members, caretakers, and with consultants:1} {Document social determinants of health affecting pt's care:1} {Document your decision making why or why not admission, treatments were needed:1} Final Clinical Impression(s) / ED Diagnoses Final diagnoses:  None    Rx / DC Orders ED Discharge Orders     None

## 2022-11-19 NOTE — ED Triage Notes (Addendum)
Pt states exposure to poison Ivy on Saturday, started having Sx Saturday night, was moderate, was using OTC meds. States it has progressively gotten worse since. Pt has hives, and swelling in triage. States has had benadryl today.

## 2023-04-21 ENCOUNTER — Other Ambulatory Visit: Payer: Self-pay | Admitting: Neurology

## 2023-04-21 DIAGNOSIS — R200 Anesthesia of skin: Secondary | ICD-10-CM

## 2023-04-21 DIAGNOSIS — G5603 Carpal tunnel syndrome, bilateral upper limbs: Secondary | ICD-10-CM

## 2023-04-21 NOTE — Telephone Encounter (Signed)
Referral in for the pt

## 2023-04-21 NOTE — Telephone Encounter (Signed)
Patient called in and said she never made an appt with hand surgeon and would like to be re-referred.

## 2023-04-22 NOTE — Telephone Encounter (Signed)
Referral re-sent to the [facility] [place].

## 2023-04-29 ENCOUNTER — Other Ambulatory Visit: Payer: Self-pay | Admitting: Orthopedic Surgery

## 2023-05-10 ENCOUNTER — Emergency Department (HOSPITAL_BASED_OUTPATIENT_CLINIC_OR_DEPARTMENT_OTHER): Payer: Medicaid Other

## 2023-05-10 ENCOUNTER — Other Ambulatory Visit: Payer: Self-pay

## 2023-05-10 ENCOUNTER — Encounter (HOSPITAL_BASED_OUTPATIENT_CLINIC_OR_DEPARTMENT_OTHER): Payer: Self-pay

## 2023-05-10 ENCOUNTER — Emergency Department (HOSPITAL_BASED_OUTPATIENT_CLINIC_OR_DEPARTMENT_OTHER)
Admission: EM | Admit: 2023-05-10 | Discharge: 2023-05-10 | Disposition: A | Discharge status: Home / Self Care | Payer: Medicaid Other | Attending: Emergency Medicine | Admitting: Emergency Medicine

## 2023-05-10 DIAGNOSIS — R1031 Right lower quadrant pain: Secondary | ICD-10-CM | POA: Insufficient documentation

## 2023-05-10 DIAGNOSIS — D72819 Decreased white blood cell count, unspecified: Secondary | ICD-10-CM | POA: Insufficient documentation

## 2023-05-10 LAB — URINALYSIS, ROUTINE W REFLEX MICROSCOPIC
Bilirubin Urine: NEGATIVE
Glucose, UA: NEGATIVE mg/dL
Hgb urine dipstick: NEGATIVE
Ketones, ur: NEGATIVE mg/dL
Nitrite: NEGATIVE
Protein, ur: NEGATIVE mg/dL
Specific Gravity, Urine: >=1.03 (ref 1.005–1.030)
pH: 7 (ref 5.0–8.0)

## 2023-05-10 LAB — URINALYSIS, MICROSCOPIC (REFLEX)

## 2023-05-10 LAB — COMPREHENSIVE METABOLIC PANEL
ALT: 31 U/L (ref 0–44)
AST: 24 U/L (ref 15–41)
Albumin: 4 g/dL (ref 3.5–5.0)
Alkaline Phosphatase: 40 U/L (ref 38–126)
Anion gap: 9 (ref 5–15)
BUN: 14 mg/dL (ref 6–20)
CO2: 24 mmol/L (ref 22–32)
Calcium: 8.9 mg/dL (ref 8.9–10.3)
Chloride: 108 mmol/L (ref 98–111)
Creatinine, Ser: 0.81 mg/dL (ref 0.44–1.00)
GFR, Estimated: >60 mL/min (ref >60)
Glucose, Bld: 81 mg/dL (ref 70–99)
Potassium: 3.6 mmol/L (ref 3.5–5.1)
Sodium: 141 mmol/L (ref 135–145)
Total Bilirubin: 0.6 mg/dL (ref 0.3–1.2)
Total Protein: 8 g/dL (ref 6.5–8.1)

## 2023-05-10 LAB — CBC WITH DIFFERENTIAL/PLATELET
Abs Immature Granulocytes: 0.01 10*3/uL (ref 0.00–0.07)
Basophils Absolute: 0 10*3/uL (ref 0.0–0.1)
Basophils Relative: 0 %
Eosinophils Absolute: 0 10*3/uL (ref 0.0–0.5)
Eosinophils Relative: 0 %
HCT: 36.2 % (ref 36.0–46.0)
Hemoglobin: 12.6 g/dL (ref 12.0–15.0)
Immature Granulocytes: 0 %
Lymphocytes Relative: 23 %
Lymphs Abs: 0.6 10*3/uL — ABNORMAL LOW (ref 0.7–4.0)
MCH: 31.8 pg (ref 26.0–34.0)
MCHC: 34.8 g/dL (ref 30.0–36.0)
MCV: 91.4 fL (ref 80.0–100.0)
Monocytes Absolute: 0.5 10*3/uL (ref 0.1–1.0)
Monocytes Relative: 18 %
Neutro Abs: 1.5 10*3/uL — ABNORMAL LOW (ref 1.7–7.7)
Neutrophils Relative %: 59 %
Platelets: 214 10*3/uL (ref 150–400)
RBC: 3.96 MIL/uL (ref 3.87–5.11)
RDW: 11.2 % — ABNORMAL LOW (ref 11.5–15.5)
WBC: 2.7 10*3/uL — ABNORMAL LOW (ref 4.0–10.5)
nRBC: 0 % (ref 0.0–0.2)

## 2023-05-10 LAB — PREGNANCY, URINE: Preg Test, Ur: NEGATIVE

## 2023-05-10 LAB — LIPASE, BLOOD: Lipase: 30 U/L (ref 11–51)

## 2023-05-10 MED ORDER — IOHEXOL 300 MG/ML  SOLN
100.0000 mL | Freq: Once | INTRAMUSCULAR | Status: AC | PRN
  Administered 2023-05-10: 80 mL via INTRAVENOUS

## 2023-05-10 MED ORDER — HYOSCYAMINE SULFATE 0.125 MG SL SUBL
0.1250 mg | SUBLINGUAL_TABLET | SUBLINGUAL | 0 refills | Status: AC | PRN
Start: 2023-05-10 — End: ?

## 2023-05-10 NOTE — Discharge Instructions (Signed)

## 2023-05-10 NOTE — ED Provider Notes (Signed)
[place] [facility] Provider Note   CSN: [id number] Arrival date & time: 05/10/23  0857     History  Chief Complaint  Patient presents with   Abdominal Pain    Christina Hampton is a 22 y.o. female.   Abdominal Pain Pain location:  RLQ Pain quality: cramping   Pain radiates to:  Does not radiate Pain severity:  Moderate Timing:  Intermittent Progression:  Resolved Chronicity:  New Context: not alcohol use, not awakening from sleep, not diet changes, not eating, not laxative use, not medication withdrawal, not previous surgeries, not recent illness, not recent sexual activity, not recent travel, not retching, not sick contacts, not suspicious food intake and not trauma   Relieved by:  Nothing Worsened by:  Nothing Ineffective treatments:  None tried Associated symptoms: no anorexia, no belching, no chest pain, no chills, no constipation, no cough, no diarrhea, no dysuria, no fatigue, no fever, no flatus, no hematemesis, no hematochezia, no hematuria, no melena, no nausea, no shortness of breath, no sore throat, no vaginal bleeding, no vaginal discharge and no vomiting   Risk factors comment:  On Depo shot, LMP sometime in May 24, Last shot in sept 24      Home Medications Prior to Admission medications   Medication Sig Start Date End Date Taking? Authorizing Provider  betamethasone dipropionate (DIPROLENE) 0.05 % ointment Apply topically 2 (two) times daily. 10/15/22   Jeanelle Malling, PA  cetirizine (ZYRTEC) 10 MG tablet Take 10 mg by mouth daily as needed. 07/07/19   [provider]  EPINEPHrine 0.3 mg/0.3 mL IJ SOAJ injection Inject 0.3 mg into the muscle as needed for anaphylaxis. 11/19/22   Alvira Monday, MD  fluticasone (FLONASE) 50 MCG/ACT nasal spray SMARTSIG:1 Spray(s) Both Nares Daily PRN 11/11/19   [provider]  hydrOXYzine (ATARAX) 25 MG tablet Take 1-2 tablets (25-50 mg total) by mouth every 6 (six) hours as needed  for itching. 11/19/22   Alvira Monday, MD  loratadine (CLARITIN) 10 MG tablet Take 1 tablet (10 mg total) by mouth daily. 10/15/22   Jeanelle Malling, PA  naproxen (NAPROSYN) 500 MG tablet Take 1 tablet (500 mg total) by mouth 2 (two) times daily. 01/04/22   Henderly, Britni A, PA-C  predniSONE (DELTASONE) 10 MG tablet Take 6 tablets today, 4 tablets for 4 days, 3 tablets for 3 days, 2 tablets for 2 days, 1 tablet for 1 day 11/19/22   Alvira Monday, MD  TRI-LO-SPRINTEC 0.18/0.215/0.25 MG-25 MCG tab Take 1 tablet by mouth every morning. 11/11/19   [provider]      Allergies    Penicillins    Review of Systems   Review of Systems  Constitutional:  Negative for chills, fatigue and fever.  HENT:  Negative for sore throat.   Respiratory:  Negative for cough and shortness of breath.   Cardiovascular:  Negative for chest pain.  Gastrointestinal:  Positive for abdominal pain. Negative for anorexia, constipation, diarrhea, flatus, hematemesis, hematochezia, melena, nausea and vomiting.  Genitourinary:  Negative for dysuria, hematuria, vaginal bleeding and vaginal discharge.    Physical Exam Updated Vital Signs BP (!) 136/53 (BP Location: Right Arm)   Pulse 78   Temp 98.4 F (36.9 C) (Oral)   Resp 16   Ht 5\' 2"  (1.575 m)   Wt 80 kg   LMP  (LMP Unknown)   SpO2 100%   BMI 32.26 kg/m  Physical Exam  ED Results / Procedures / Treatments  Labs (all labs ordered are listed, but only abnormal results are displayed) Labs Reviewed  URINALYSIS, ROUTINE W REFLEX MICROSCOPIC  PREGNANCY, URINE    EKG None  Radiology No results found.  Procedures Procedures    Medications Ordered in ED Medications - No data to display  ED Course/ Medical Decision Making/ A&P Clinical Course as of 05/10/23 1913  Sat May 10, 2023  1001 Bacteria, UA(!): MANY [AH]  1001 Urinalysis, Microscopic (reflex)(!) [AH]  1001 WBC(!): 2.7 [AH]  1001 Urinalysis, Routine w reflex microscopic -Urine, Clean  Catch(!) [AH]  1001 RBC / HPF: 0-5 [AH]  1001 WBC, UA: 0-5 [AH]  1054 CT ABDOMEN PELVIS W CONTRAST [AH]    Clinical Course User Index [AH] Arthor Captain, PA-C                                 Medical Decision Making This patient presents to the ED for concern of RLQ abd pain, this involves an extensive number of treatment options, and is a complaint that carries with it a high risk of complications and morbidity. The differential diagnosis for RUQ is, but not limited to:  Cholelithiasis, cholecystitis, hepatitis, eg, viral, alcoholic, toxic, cholangitis or choledocholithiasis, peptic ulcer disease (duodenal), pancreatitis, functional or nonulcer dyspepsia, liver abscess, liver, pancreatic, or biliary tract cancer, ischemic hepatopathy (shock liver), hepatic vein obstruction (Budd-Chiari syndrome), right lower lobe pneumonia, pyelonephritis, urinary calculi,  Fitz-Hugh-Curtis syndrome (with pelvic inflammatory disease), liver cell adenoma, herpes zoster, trauma or musculoskeletal pain, herniated disk, abdominal abscess , intestinal ischemia, physical or sexual abuse, ectopic pregnancy, IUP, Mittelschmerz, appendicitis, cholecystitis, ovarian cyst/torsion, threatened/ievitable abortion, PID, endometriosis, molar pregnancy, UTI/renal colic, heterotopic pregnancy, IBD, corpus luteum cyst   Co morbidities:       has no past medical history on file.   Social Determinants of Health:       SDOH Screenings Tobacco Use: Low Risk  (05/10/2023)   Additional history:  {Additional history obtained from emr   Lab Tests:  I Ordered, and personally interpreted labs.  The pertinent results include: Mild leukopenia, no other acute findings.  Urine appears contaminated.  Imaging Studies:  I ordered imaging studies including CT abdomen pelvis and ultrasound of the pelvis I independently visualized and interpreted imaging which showed no acute findings I agree with the radiologist  interpretation  Cardiac Monitoring/ECG:  Medicines ordered and prescription drug management:  Test Considered:       I considered STI testing however patient denies any symptoms of STI.  Critical Interventions:         Consultations Obtained:   Problem List / ED Course:       No diagnosis found.  MDM: Patient here with right lower quadrant cramping pain.  Intermittent.  No pain at this time.  Evaluation shows no emergent causes of symptoms.  Will treat with Levsin, anti-inflammatories and close PCP follow-up.  Discussed outpatient follow-up and return precautions.  No concern for urinary tract infection.  Appropriate for discharge.   Dispostion:  After consideration of the diagnostic results and the patients response to treatment, I feel that the patent would benefit from discharge.    Amount and/or Complexity of Data Reviewed Labs: ordered. Decision-making details documented in ED Course. Radiology: ordered. Decision-making details documented in ED Course.  Risk Prescription drug management.           Final Clinical Impression(s) / ED Diagnoses Final diagnoses:  Right lower quadrant abdominal  pain    Rx / DC Orders ED Discharge Orders     None         Arthor Captain, PA-C 05/10/23 1914    Vanetta Mulders, MD 05/11/23 [phone]

## 2023-05-10 NOTE — ED Triage Notes (Signed)
The patient having lower abd pain. No fever, V/D, urinary symptoms, vaginal discharge or bleeding.

## 2023-06-17 ENCOUNTER — Encounter (HOSPITAL_BASED_OUTPATIENT_CLINIC_OR_DEPARTMENT_OTHER): Payer: Self-pay | Admitting: Orthopedic Surgery

## 2023-06-27 ENCOUNTER — Ambulatory Visit (HOSPITAL_BASED_OUTPATIENT_CLINIC_OR_DEPARTMENT_OTHER): Admission: RE | Admit: 2023-06-27 | Payer: Medicaid Other | Source: Home / Self Care | Admitting: Orthopedic Surgery

## 2023-06-27 ENCOUNTER — Encounter (HOSPITAL_BASED_OUTPATIENT_CLINIC_OR_DEPARTMENT_OTHER): Admission: RE | Payer: Self-pay | Source: Home / Self Care

## 2023-06-27 HISTORY — DX: Other seasonal allergic rhinitis: J30.2

## 2023-06-27 SURGERY — CARPAL TUNNEL RELEASE
Anesthesia: Choice | Laterality: Right

## 2023-08-24 ENCOUNTER — Encounter (HOSPITAL_BASED_OUTPATIENT_CLINIC_OR_DEPARTMENT_OTHER): Payer: Self-pay | Admitting: Emergency Medicine

## 2023-08-24 ENCOUNTER — Other Ambulatory Visit: Payer: Self-pay

## 2023-08-24 ENCOUNTER — Emergency Department (HOSPITAL_BASED_OUTPATIENT_CLINIC_OR_DEPARTMENT_OTHER)
Admission: EM | Admit: 2023-08-24 | Discharge: 2023-08-24 | Disposition: A | Discharge status: Home / Self Care | Payer: Self-pay | Attending: Emergency Medicine | Admitting: Emergency Medicine

## 2023-08-24 DIAGNOSIS — J069 Acute upper respiratory infection, unspecified: Secondary | ICD-10-CM | POA: Diagnosis not present

## 2023-08-24 DIAGNOSIS — Z20822 Contact with and (suspected) exposure to covid-19: Secondary | ICD-10-CM | POA: Insufficient documentation

## 2023-08-24 DIAGNOSIS — R059 Cough, unspecified: Secondary | ICD-10-CM | POA: Diagnosis present

## 2023-08-24 LAB — RESP PANEL BY RT-PCR (RSV, FLU A&B, COVID)  RVPGX2
Influenza A by PCR: NEGATIVE
Influenza B by PCR: NEGATIVE
Resp Syncytial Virus by PCR: NEGATIVE
SARS Coronavirus 2 by RT PCR: NEGATIVE

## 2023-08-24 MED ORDER — NAPROXEN 500 MG PO TABS: 500.0000 mg | ORAL_TABLET | Freq: Two times a day (BID) | ORAL | 0 refills | Status: AC

## 2023-08-24 MED ORDER — PROMETHAZINE-DM 6.25-15 MG/5ML PO SYRP: 5.0000 mL | ORAL_SOLUTION | Freq: Four times a day (QID) | ORAL | 0 refills | Status: AC | PRN

## 2023-08-24 NOTE — ED Triage Notes (Signed)
Pt exposed to flu.  Pt having sore throat and cough x 3 days.  No known fever.

## 2023-08-24 NOTE — ED Provider Notes (Signed)
EMERGENCY DEPARTMENT AT [facility] Provider Note   CSN: [id number] Arrival date & time: 08/24/23  9562     History  Chief Complaint  Patient presents with   URI    Christina Hampton is a 23 y.o. female.  Patient is a 23 year old female who presents to the Emergency Department with a chief complaint of cough, generalized malaise and fatigue, sore throat which has been ongoing for approximate the past 3 days.  Patient notes that she has been exposed to an individual who tested positive for the flu and notes that she would just like to be tested for this at this time.  She notes that she has had no associated chest pain, shortness of breath, abdominal pain, nausea, vomiting, diarrhea.  She denies any associated dizziness, lightheadedness or syncope.  She denies any abnormal headaches, pain to neck or back or abnormal rashes.   URI Presenting symptoms: congestion, cough, fatigue, rhinorrhea and sore throat        Home Medications Prior to Admission medications   Medication Sig Start Date End Date Taking? Authorizing Provider  betamethasone dipropionate (DIPROLENE) 0.05 % ointment Apply topically 2 (two) times daily. 10/15/22   Jeanelle Malling, PA  cetirizine (ZYRTEC) 10 MG tablet Take 10 mg by mouth daily as needed. 07/07/19   [provider]  EPINEPHrine 0.3 mg/0.3 mL IJ SOAJ injection Inject 0.3 mg into the muscle as needed for anaphylaxis. 11/19/22   Alvira Monday, MD  fluticasone (FLONASE) 50 MCG/ACT nasal spray SMARTSIG:1 Spray(s) Both Nares Daily PRN 11/11/19   [provider]  hydrOXYzine (ATARAX) 25 MG tablet Take 1-2 tablets (25-50 mg total) by mouth every 6 (six) hours as needed for itching. 11/19/22   Alvira Monday, MD  hyoscyamine (LEVSIN/SL) 0.125 MG SL tablet Place 1 tablet (0.125 mg total) under the tongue every 4 (four) hours as needed (pain). Up to 1.25 mg daily 05/10/23   Arthor Captain, PA-C  loratadine (CLARITIN) 10 MG tablet Take 1  tablet (10 mg total) by mouth daily. 10/15/22   Jeanelle Malling, PA  naproxen (NAPROSYN) 500 MG tablet Take 1 tablet (500 mg total) by mouth 2 (two) times daily. 01/04/22   Henderly, Britni A, PA-C  predniSONE (DELTASONE) 10 MG tablet Take 6 tablets today, 4 tablets for 4 days, 3 tablets for 3 days, 2 tablets for 2 days, 1 tablet for 1 day 11/19/22   Alvira Monday, MD  TRI-LO-SPRINTEC 0.18/0.215/0.25 MG-25 MCG tab Take 1 tablet by mouth every morning. 11/11/19   [provider]      Allergies    Penicillins    Review of Systems   Review of Systems  Constitutional:  Positive for fatigue.  HENT:  Positive for congestion, rhinorrhea and sore throat.   Respiratory:  Positive for cough.   All other systems reviewed and are negative.   Physical Exam Updated Vital Signs BP 116/70 (BP Location: Right Arm)   Pulse 94   Temp 99.2 F (37.3 C) (Oral)   Resp 18   Ht 5\' 3"  (1.6 m)   Wt 84.4 kg   SpO2 99%   BMI 32.95 kg/m  Physical Exam Constitutional:      Appearance: Normal appearance.  HENT:     Head: Normocephalic and atraumatic.     Nose: Nose normal.     Mouth/Throat:     Mouth: Mucous membranes are moist.     Pharynx: No oropharyngeal exudate or posterior oropharyngeal erythema.  Eyes:  Extraocular Movements: Extraocular movements intact.     Conjunctiva/sclera: Conjunctivae normal.     Pupils: Pupils are equal, round, and reactive to light.  Cardiovascular:     Rate and Rhythm: Normal rate and regular rhythm.     Pulses: Normal pulses.     Heart sounds: Normal heart sounds. No murmur heard.    No gallop.  Pulmonary:     Effort: Pulmonary effort is normal. No respiratory distress.     Breath sounds: Normal breath sounds. No stridor. No wheezing, rhonchi or rales.  Abdominal:     General: Abdomen is flat. Bowel sounds are normal. There is no distension.     Palpations: Abdomen is soft.     Tenderness: There is no abdominal tenderness.  Musculoskeletal:        General:  Normal range of motion.     Cervical back: Normal range of motion and neck supple.  Skin:    General: Skin is warm and dry.     Findings: No rash.  Neurological:     General: No focal deficit present.     Mental Status: She is alert and oriented to person, place, and time. Mental status is at baseline.  Psychiatric:        Mood and Affect: Mood normal.        Behavior: Behavior normal.        Thought Content: Thought content normal.        Judgment: Judgment normal.     ED Results / Procedures / Treatments   Labs (all labs ordered are listed, but only abnormal results are displayed) Labs Reviewed  RESP PANEL BY RT-PCR (RSV, FLU A&B, COVID)  [id number]    EKG None  Radiology No results found.  Procedures Procedures    Medications Ordered in ED Medications - No data to display  ED Course/ Medical Decision Making/ A&P                                 Medical Decision Making Patient is doing well at this time and is stable for discharge home.  Discussed with patient that she has negative her for COVID-19, influenza and RSV.  Suspect the patient supplemental an acute viral syndrome at this point.  Patient has stable vital signs with no indication for sepsis no associated hypoxia.  She has no indication for acute peritonsillar abscess, Ludwig angina, retropharyngeal abscess, epiglottitis.  The need for continued symptomatic treatment outpatient basis was discussed as well as strict turn precautions for any new or worsening symptoms.  Patient voiced understand to the plan and had no additional questions.  Risk Prescription drug management.           Final Clinical Impression(s) / ED Diagnoses Final diagnoses:  None    Rx / DC Orders ED Discharge Orders     None         Lelon Perla, PA-C 08/24/23 1044    Horton, Clabe Seal, DO 08/24/23 1528
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
## 2024-01-06 NOTE — H&P (View-Only) (Signed)
 History and Physical  Assessment  Carpal tunnel syndrome of left wrist   Plan  We reviewed the risks, benefits, and potential complications of both operative and nonoperative management. At this point the patient would like to proceed with operative treatment. We reviewed the risks to include but not limited to pain; scar; infection; allergic reaction; failure of the procedure; need for future procedures; damage to surrounding blood vessels, nerves, and organs; paralysis; paraplegia; quadriplegia; cardiac arrest; brain damage; and death. The risks were understood and consent form was signed. The planned procedure is a left carpal tunnel release and indicated procedures. DOS 01/15/2024.      History  History of Present Illness HPI:  The patient returns today for follow-up of her left carpal tunnel syndrome. She states that right hand is doing well after her carpal tunnel release. Her pain level today is 8/10 in her left hand. She would like to discuss CTR on the left hand. She denies any falls or injuries since last visit.   Past Medical History:  Diagnosis Date  . Carpal tunnel syndrome on both sides    Past Surgical History:  Procedure Laterality Date  . Carpal tunnel release Right 09/03/2023   Dr.Potter  . Tonsillectomy      Allergies[1] Prior to Admission medications   Medication Sig Start Date End Date Taking? Authorizing Provider  azithromycin (ZITHROMAX) 250 mg tablet Take one tablet (250 mg dose) by mouth daily. Patient not taking: Reported on 01/01/2024 12/18/23   Historical Provider, MD  betamethasone  dipropionate (DIPROSONE ) 0.05% ointment Apply one application. topically 2 (two) times daily. 10/15/22   Historical Provider, MD  EPINEPHrine  (AUVI-Q ,EPIPEN ) 0.3 mg/0.3 mL injection Inject 0.3 mLs (0.3 mg dose) into the muscle as needed. 11/19/22   Historical Provider, MD  ibuprofen (ADVIL,MOTRIN) 800 mg tablet Take one tablet (800 mg dose) by mouth every 6 (six) hours as needed.  09/03/23   Elspeth CHRISTELLA Farrow, MD  medroxyPROGESTERone (DEPO-PROVERA) 150 mg/mL injection Inject 1 mL (150 mg dose) into the muscle every 3 (three) months. 07/30/23   Historical Provider, MD  predniSONE  5 MG (21) TBPK Take by mouth as needed. 12/18/23   Historical Provider, MD   Social History   Socioeconomic History  . Marital status: Single  Tobacco Use  . Smoking status: Never  . Smokeless tobacco: Never  Vaping Use  . Vaping status: Never Used  Substance and Sexual Activity  . Alcohol use: Yes    Comment: occ  . Drug use: Never  . Sexual activity: Not Currently  Social History Narrative   ** Merged History Encounter **       No family history on file. Review of Systems  Constitutional: Negative.   HENT: Negative.    Eyes: Negative.   Respiratory: Negative.    Cardiovascular: Negative.   Gastrointestinal: Negative.   Endocrine: Negative.   Genitourinary: Negative.   Musculoskeletal:        As noted in HPI  Skin: Negative.   Allergic/Immunologic: Negative.   Neurological: Negative.   Hematological: Negative.   Psychiatric/Behavioral: Negative.          Physical Examination        AFVSS  PE: GEN: AOx3, NAD, affect nl Eye: PERRL, bilaterally Psych: Mood and affect are within normal limit Skin:  Warm dry and intact all 4 extremities; No Focal Rashes Neuro:  Neurovascular status is intact distally all 4 extremities, grossly Chest: normal effort with no acute respiratory finding;  Cardiac: RRR Abdomen:  non-tender,  non-distended, + BS Extremities:  No lymphadenopathy is appreciated  Left Wrist: Skin intact, no gross deformity, no erythema, no ecchymosis No swelling over the wrist TTP over the volar aspect of wrist Positive Tinel's, compression Sensory and motor exams are grossly intact throughout the hand Cap refill <3 secs in all digitsPE:  Ortho Exam Physical Exam Results  Labs:  No results found for this or any previous visit (from the past 24  hours). Imaging: No results found.  EMG: 4/11/2024Conclusion:  There is electrophysiologic evidence of bilateral mild right > left Carpal Tunnel Syndrome. No suggestion of polyneuropathy or radiculopathy.    Electronically signed: Elspeth CHRISTELLA Farrow, MD 01/06/2024 / 8:59 AM      [1] Allergies Allergen Reactions  . Penicillins Itching

## 2024-01-15 NOTE — Interval H&P Note (Signed)
 H&P reviewed, patient examined and there is NO CHANGE to the patient status.

## 2024-01-15 NOTE — Anesthesia Postprocedure Evaluation (Signed)
  Patient: Christina Hampton Procedure(s): LEFT CARPAL TUNNEL RELEASE  Anesthesia type: general  Anesthesia Post Evaluation  Patient location during evaluation: PACU Patient participation: complete - patient participated Level of consciousness: awake and alert Pain management: adequate Airway patency: patent Cardiovascular status: acceptable Respiratory status: acceptable Hydration status: acceptable Vitals: stable Temperature: temperature adequate >96.53F PONV: nausea and vomiting controlled Regional Anesthesia: no block performed    BP: 118/68 (01/15/24 1500) Heart Rate: 66 (01/15/24 1500) Resp: 14 (01/15/24 1412) Temp: 97.5 F (36.4 C) (01/15/24 1412) SpO2: 99 % (01/15/24 1500) Weight: 90.2 kg (198 lb 12.8 oz) (01/15/24 1108) BMI (Calculated): 34.1 (01/15/24 1108)     Notable Events:    No Anesthesia notable events documented.

## 2024-01-28 NOTE — Progress Notes (Signed)
 DOS: 01/15/24 Procedure: LEFT CARPAL TUNNEL RELEASE   Date of Exam: 01/28/2024  CC: Initial post-op visit  HPI:  The patient returns today approx 2 weeks s/p the above noted procedure.  She states she is doing well however she does continue have pain in the palm of her hand. Her pain level at present is a 4/10. She states she does have some numbness to the thumb. She denies any falls to the hand. She denies any fevers, chills, or drainage from the incision. We will remove sutures today.  ROS:  A 10-system review demonstrated no pertinant positive findings other than those of the Musculoskeletal System documented in HPI.    No changes in PMH, FH, or SocH since the last visit.  PE: Left Hand: Incision clean, dry, and intact with sutures in place. No erythema or fluctuance. Skin edges well coapted. Only numbness present to the DIP of thumb Skin bruise on volar side of hand NVID  X-rays: no new images   A/P:  1. Bilateral carpal tunnel syndrome   2. Status post carpal tunnel release     - DC sutures - Use Velcro wrist brace for protection  -           F/U in 4 weeks for re-evaluation  I have reviewed the information contained in this note and personally verified its accuracy.  I obtained or reviewed the history of present illness and personally performed the physical exam. Franky Ruddy, PA-C   Medication reconciliation completed with the patient.    Elspeth HERO. Lane, MD, [facility] Shoulder & Knee Surgery [facility] [address] [place], [place] [address] [phone] [address], [place], [place] [address] [phone]

## 2024-04-08 NOTE — Progress Notes (Signed)
 I have reviewed the notes, assessments, and/or procedures performed by CMA, I concur with her/his documentation of Christina Hampton.

## 2024-05-04 NOTE — Progress Notes (Signed)
 GENERAL SURGERY OFFICE NOTE  REFFERING PHYSICIAN: Justino Lonney Carte,*  CC:  Chief Complaint  Patient presents with  . New Patient    POSSIBLE CYST    HISTORY OF PRESENT ILLNESS: This patient  is a 23 y.o. Black or African American female who presents to the office with  a possible cyst to her upper back. Pt with a PMH of Allergic Rhinitis. Here today due to a painful lesion to her back. Pt states she first noticed the lesion a few months ago due to increasing pain. She has not noticed much changes but does state that it is hyperpigmented. Denies any drainage. Is most painful when she is applying pressure when laying down and when her bra strap rubs on it. She denies nay fever or chills. Denies any other lesions. No personal or family history of skin cancer.  ALLERGIES: Allergies[1]  MEDICATIONS: Current Medications[2]  PAST MEDICAL HISTORY: Problem List[3]  PAST SURGICAL HISTORY: Surgical History[4]  SOCIAL HISTORY: Tobacco Use History[5] Social History   Substance and Sexual Activity  Alcohol Use Never   Social History   Substance and Sexual Activity  Drug Use Never    FAMILY HISTORY: Family History[6]   REVIEW OF SYSTEMS: A full ROS was completed with pertinent positives listed in the HPI above. All other ROS negative.   Ms. 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  VITAL SIGNS:  Vitals:   05/05/24 1359  BP: 122/82  Pulse: 87  Temp: 97.4 F (36.3 C)  SpO2: 98%    PHYSICAL EXAM: General: Adult female sitting in the room comfortably, in NAD. Neuro: GCS 15, no gross deficits. Skin: Warm and dry.   R Upper back: Small ~1cm round, mobile hyperpigmented area with a central punctum. Minimal TTP. No active drainage or are of fluctuance. No drainage with pressure. No erythema or warmth.  Lungs: Normal effort of breathing on room air. Heart: Regular rate MSK: Warm, well perfused. Moves all extremities. Psych: Appropriate mood and affect.      ASSESSMENT/PLAN Ms. Bayle, 23 year old female seen today for further evaluation of a likely epidermal inclusion cyst. Cyst without any signs of infection today. Discussed that this is likely a benign skin lesion and does not need to be further excised and can be monitored for any growth or unwanted changes. However given the location and constant irritation, patient wishes to move forward with excision. Briefly discussed excision procedure including risks such as pain, bleeding, infection, incomplete excision, recurrence and damage to nearby structures. Pt voiced understanding and wishes to proceed. Plan for procedure on 10/24. Advised to call sooner if any questions or concerns.   Electronically Signed, Erica C. Kerr PA-C       [1] Allergies Allergen Reactions  . Penicillins Itching  [2]  Current Outpatient Medications:  .  EPINEPHrine  (EPIPEN ) 0.3 mg/0.3 mL injection syringe, Inject 0.3 mg into the thigh per protocol (see comments)., Disp: , Rfl:  .  fluticasone propionate (FLONASE) 50 mcg/spray nasal spray, ADMINISTER 1 SPRAY INTO AFFECTED NOSTRIL(S) DAILY, Disp: 16 g, Rfl: 2 .  hydrOXYzine  (ATARAX ) 25 mg tablet, Take 1-2 tablets (25-50 mg total) by mouth every 8 (eight) hours. As needed, Disp: 30 tablet, Rfl: 1 .  ketoconazole (NIZORAL) 2 % cream, Apply topically daily., Disp: 60 g, Rfl: 0 .  levocetirizine (XYZAL) 5 mg tablet, TAKE 1 TABLET(5 MG) BY MOUTH DAILY, Disp: 30 tablet, Rfl: 0 .  medroxyPROGESTERone (DEPO-PROVERA) 150 mg/mL syringe, Inject 1 mL (150 mg total) into  the muscle every 3 (three) months., Disp: 1 mL, Rfl: 0 .  triamcinolone acetonide (KENALOG) 0.1 % cream, Apply topically 2 (two) times a day., Disp: 80 g, Rfl: 0 [3] Patient Active Problem List Diagnosis  . Allergic rhinitis  . Bilateral carpal tunnel syndrome  [4] Past Surgical History: Procedure Laterality Date  . CARPAL TUNNEL RELEASE Right   . TONSILLECTOMY    . WISDOM TOOTH EXTRACTION     [5] Social History Tobacco Use  Smoking Status Never  Smokeless Tobacco Never  [6] Family History Problem Relation Name Age of Onset  . Hypertension Mother    . Diabetes Mother    . Depression Mother    . Anxiety disorder Mother    . Breast cancer Mother  29       DCIS, negative genetic testing  . Hypertension Father    . Heart disease Father    . Diabetes Father    . Breast cancer Maternal Grandmother  45  . Breast cancer Paternal Grandmother

## 2024-05-14 NOTE — Progress Notes (Signed)
 GENERAL SURGERY PROCEDURE NOTE:  DATE: 05/14/24  Interval history: The medical history has been reviewed and remains accurate without interval change.  The patient was re-examined and patient's physiologic condition has not changed significantly in the last 30 days. The condition still exists that makes this procedure necessary. The treatment plan remains the same, without new options for care.  No new pharmacological allergies or types of therapy has been initiated that would change the plan or the appropriateness of the plan.  Patient understands plan and wishes to proceed. All questions and concerns addressed today. Written informed consent obtained.   PRE-PROCEDURE DIAGNOSIS:  Soft tissue lesion of left back, consistent with possible cyst  POST-PROCEDURE DIAGNOSIS: same  PROCEDURE PERFORMED:   Excision of soft tissue lesion  ANESTHESIA: Lidocaine 1% with epinephrine .   LOCATION: Mid Left Back  SIZE: 1cm  PROCEDURE DETAILS: Soft Tissue Excision  Date/Time: 05/14/2024 11:00 AM  Performed by: Geni Wanda Alexander, PA-C Authorized by: Geni Wanda Alexander, PA-C  Preparation: Patient was prepped and draped in the usual sterile fashion. Local anesthesia used: yes Anesthesia: local infiltration  Anesthesia: Local anesthesia used: yes Local Anesthetic: lidocaine 1% with epinephrine  Anesthetic total: 10 mL Patient tolerance: patient tolerated the procedure well with no immediate complications Comments: After informed consent was obtained, the site was prepped and draped in sterile fashionThe area was locally anesthetized in a circumferential manner using needle and syringe. Using a scalpel and forceps, a elliptical incision was made and the cyst was excised from surrounding tissues taking great care to remove any residual cyst lining. The wound was irrigated with saline and hemostasis obtained with pressure.  The skin edges re-approximated with 3-0 Vicryl simple interrupted sutures  followed by a running subcuticular stitch using 4-0 Monocryl. Dermabond applied. Dry dressing secured in place with tape    Patient tolerated the procedure well. There were no complications.  Wound care instructions were provided.  Tylenol and ibuprofen was recommended for pain.  The specimen was submitted to pathology.  We will call patient with pathology results.  Follow-up in 2 weeks to monitor healing and discuss any questions or concerns.   Electronically Signed, Erica C. Kerr PA-C

## 2024-05-24 NOTE — ED Triage Notes (Signed)
 Chest pain that started last Thursday. Denies N/V/D.

## 2024-05-25 ENCOUNTER — Emergency Department (HOSPITAL_BASED_OUTPATIENT_CLINIC_OR_DEPARTMENT_OTHER)

## 2024-05-25 ENCOUNTER — Emergency Department (HOSPITAL_BASED_OUTPATIENT_CLINIC_OR_DEPARTMENT_OTHER)
Admission: EM | Admit: 2024-05-25 | Discharge: 2024-05-25 | Disposition: A | Discharge status: Home / Self Care | Attending: Emergency Medicine | Admitting: Emergency Medicine

## 2024-05-25 ENCOUNTER — Encounter (HOSPITAL_BASED_OUTPATIENT_CLINIC_OR_DEPARTMENT_OTHER): Payer: Self-pay | Admitting: Emergency Medicine

## 2024-05-25 ENCOUNTER — Other Ambulatory Visit: Payer: Self-pay

## 2024-05-25 DIAGNOSIS — R0602 Shortness of breath: Secondary | ICD-10-CM | POA: Diagnosis not present

## 2024-05-25 DIAGNOSIS — R079 Chest pain, unspecified: Secondary | ICD-10-CM | POA: Insufficient documentation

## 2024-05-25 LAB — HEPATIC FUNCTION PANEL
ALT: 8 U/L (ref 0–44)
AST: 27 U/L (ref 15–41)
Albumin: 4.2 g/dL (ref 3.5–5.0)
Alkaline Phosphatase: 58 U/L (ref 38–126)
Bilirubin, Direct: 0.1 mg/dL (ref 0.0–0.2)
Indirect Bilirubin: 0.3 mg/dL (ref 0.3–0.9)
Total Bilirubin: 0.4 mg/dL (ref 0.0–1.2)
Total Protein: 8.1 g/dL (ref 6.5–8.1)

## 2024-05-25 LAB — BASIC METABOLIC PANEL WITH GFR
Anion gap: 11 (ref 5–15)
BUN: 11 mg/dL (ref 6–20)
CO2: 22 mmol/L (ref 22–32)
Calcium: 9 mg/dL (ref 8.9–10.3)
Chloride: 106 mmol/L (ref 98–111)
Creatinine, Ser: 0.62 mg/dL (ref 0.44–1.00)
GFR, Estimated: >60 mL/min (ref >60)
Glucose, Bld: 100 mg/dL — ABNORMAL HIGH (ref 70–99)
Potassium: 3.5 mmol/L (ref 3.5–5.1)
Sodium: 140 mmol/L (ref 135–145)

## 2024-05-25 LAB — LIPASE, BLOOD: Lipase: 34 U/L (ref 11–51)

## 2024-05-25 LAB — CBC
HCT: 37.1 % (ref 36.0–46.0)
Hemoglobin: 12.7 g/dL (ref 12.0–15.0)
MCH: 31.1 pg (ref 26.0–34.0)
MCHC: 34.2 g/dL (ref 30.0–36.0)
MCV: 90.7 fL (ref 80.0–100.0)
Platelets: 337 K/uL (ref 150–400)
RBC: 4.09 MIL/uL (ref 3.87–5.11)
RDW: 11.7 % (ref 11.5–15.5)
WBC: 4.5 K/uL (ref 4.0–10.5)
nRBC: 0 % (ref 0.0–0.2)

## 2024-05-25 LAB — TROPONIN T, HIGH SENSITIVITY: Troponin T High Sensitivity: <15 ng/L (ref 0–19)

## 2024-05-25 LAB — PREGNANCY, URINE: Preg Test, Ur: NEGATIVE

## 2024-05-25 LAB — D-DIMER, QUANTITATIVE: D-Dimer, Quant: 0.43 ug{FEU}/mL (ref 0.00–0.50)

## 2024-05-25 NOTE — ED Notes (Signed)
 ED Provider at bedside.

## 2024-05-25 NOTE — Discharge Instructions (Addendum)
 It was a pleasure taking care of you here in the emergency department today  As we discussed your workup today was reassuring.  I continue taking Tylenol and anti-inflammatories as needed for your symptoms.  Make sure to follow back up with your primary care provider  Return for new or worsening symptoms.

## 2024-05-25 NOTE — ED Triage Notes (Signed)
 Pt c/o chest pain for almost a week.  Recently seen for same at [facility]. Dyspnea on exertion.   - n/v/d.   Took ibuprofen appx 1400.

## 2024-05-25 NOTE — ED Notes (Signed)
 Lab verified they have blood specimens for hepatic function and lipase add-ons. Labs in process currently.

## 2024-05-25 NOTE — ED Provider Notes (Signed)
 [facility] [facility]ERG[facility] DEPARTMENT AT [facility] Provider Note   CSN: [id number] Arrival date & time: 05/25/24  1637     Patient presents with: Chest Pain   Christina Hampton is a 23 y.o. female here for evaluation of chest pain, worse with movement, deep breathing. Pain started a week ago.Seen at OSH yesterday for similar- Had reassuring workup at that time.  She called her PCP today let them know she was seen given she was still having pain and they urged her to come back to the emergency department.  Pain does not radiate.  Has some occasional shortness of breath.  No history of PE or DVT.  No recent surgery, immobilization or malignancy.  No pain or swelling to legs.  Pain is not exertional in nature.  Does not radiate to left arm, left back or jaw.  No associated diaphoresis, nausea or vomiting.  No recent illnesses.  No cough, fever, abdominal pain, nausea or vomiting, melena or BRBPR.  Has been taking ibuprofen. No chronic NSAID use, etoh use, illicit substance use.   HPI     Prior to Admission medications   Medication Sig Start Date End Date Taking? Authorizing Provider  betamethasone  dipropionate (DIPROLENE ) 0.05 % ointment Apply topically 2 (two) times daily. 10/15/22   Ladora Congress, PA  cetirizine (ZYRTEC) 10 MG tablet Take 10 mg by mouth daily as needed. 07/07/19   [provider]  EPINEPHrine  0.3 mg/0.3 mL IJ SOAJ injection Inject 0.3 mg into the muscle as needed for anaphylaxis. 11/19/22   Dreama Longs, MD  fluticasone (FLONASE) 50 MCG/ACT nasal spray SMARTSIG:1 Spray(s) Both Nares Daily PRN 11/11/19   [provider]  hydrOXYzine  (ATARAX ) 25 MG tablet Take 1-2 tablets (25-50 mg total) by mouth every 6 (six) hours as needed for itching. 11/19/22   Dreama Longs, MD  hyoscyamine  (LEVSIN AMIEL) 0.125 MG SL tablet Place 1 tablet (0.125 mg total) under the tongue every 4 (four) hours as needed (pain). Up to 1.25 mg daily 05/10/23   Harris, Abigail, PA-C   loratadine  (CLARITIN ) 10 MG tablet Take 1 tablet (10 mg total) by mouth daily. 10/15/22   Ladora Congress, PA  naproxen  (NAPROSYN ) 500 MG tablet Take 1 tablet (500 mg total) by mouth 2 (two) times daily. 08/24/23   Daralene Lonni BIRCH, PA-C  predniSONE  (DELTASONE ) 10 MG tablet Take 6 tablets today, 4 tablets for 4 days, 3 tablets for 3 days, 2 tablets for 2 days, 1 tablet for 1 day 11/19/22   Dreama Longs, MD  promethazine -dextromethorphan (PROMETHAZINE -DM) 6.25-15 MG/5ML syrup Take 5 mLs by mouth 4 (four) times daily as needed for cough. 08/24/23   Daralene Lonni BIRCH, PA-C  TRI-LO-SPRINTEC 0.18/0.215/0.25 MG-25 MCG tab Take 1 tablet by mouth every morning. 11/11/19   [provider]    Allergies: Penicillins    Review of Systems  Constitutional: Negative.   HENT: Negative.    Respiratory:  Positive for shortness of breath. Negative for cough, choking, chest tightness, wheezing and stridor.   Cardiovascular:  Positive for chest pain. Negative for palpitations and leg swelling.  Gastrointestinal: Negative.   Genitourinary: Negative.   Musculoskeletal: Negative.   Skin: Negative.   Neurological: Negative.   All other systems reviewed and are negative.   Updated Vital Signs BP 111/65   Pulse 77   Temp 98.9 F (37.2 C) (Oral)   Resp (!) 21   Ht 5' 3 (1.6 m)   Wt 99.8 kg   SpO2 100%   BMI 38.97  kg/m   Physical Exam Vitals and nursing note reviewed.  Constitutional:      General: She is not in acute distress.    Appearance: She is well-developed. She is not ill-appearing, toxic-appearing or diaphoretic.  HENT:     Head: Atraumatic.  Eyes:     Pupils: Pupils are equal, round, and reactive to light.  Cardiovascular:     Rate and Rhythm: Normal rate.     Pulses:          Radial pulses are 2+ on the right side and 2+ on the left side.       Dorsalis pedis pulses are 2+ on the right side and 2+ on the left side.     Heart sounds: Normal heart sounds.  Pulmonary:      Effort: Pulmonary effort is normal. No respiratory distress.     Breath sounds: Normal breath sounds.     Comments: Clear bilaterally, speaks in full sentences without difficulty Chest:     Chest wall: Tenderness present. No crepitus.     Comments: Anterior chest wall tenderness without crepitus or step-off Abdominal:     General: Bowel sounds are normal. There is no distension.     Palpations: Abdomen is soft.     Comments: Soft, nontender, no rebound or guarding.  Negative CVA tap bilaterally.  Musculoskeletal:        General: Normal range of motion.     Cervical back: Normal range of motion.     Right lower leg: No tenderness. No edema.     Left lower leg: No tenderness. No edema.     Comments: No bony tenderness, compartments soft, full range of motion, Homan sign negative  Skin:    General: Skin is warm and dry.     Capillary Refill: Capillary refill takes less than 2 seconds.  Neurological:     General: No focal deficit present.     Mental Status: She is alert.  Psychiatric:        Mood and Affect: Mood normal.     (all labs ordered are listed, but only abnormal results are displayed) Labs Reviewed  BASIC METABOLIC PANEL WITH GFR - Abnormal; Notable for the following components:      Result Value   Glucose, Bld 100 (*)    All other components within normal limits  CBC  PREGNANCY, URINE  D-DIMER, QUANTITATIVE  HEPATIC FUNCTION PANEL  LIPASE, BLOOD  TROPONIN T, HIGH SENSITIVITY    EKG: EKG Interpretation Date/Time:  Tuesday May 25 2024 16:47:37 EST Ventricular Rate:  87 PR Interval:  157 QRS Duration:  81 QT Interval:  349 QTC Calculation: 420 R Axis:   89  Text Interpretation: Sinus rhythm Borderline T abnormalities, diffuse leads No previous tracing Confirmed by Doretha Folks ([id number]) on 05/25/2024 8:14:36 PM  Radiology: [facility] Chest 2 View Result Date: 05/25/2024 EXAM: 2 VIEW(S) XRAY OF THE CHEST 05/25/2024 05:08:42 PM COMPARISON: Chest x-ray  06/24/2021. CLINICAL HISTORY: chest pain FINDINGS: LUNGS AND PLEURA: No focal pulmonary opacity. No pulmonary edema. No pleural effusion. No pneumothorax. HEART AND MEDIASTINUM: No acute abnormality of the cardiac and mediastinal silhouettes. BONES AND SOFT TISSUES: No acute osseous abnormality. IMPRESSION: 1. No acute process. Electronically signed by: Greig Pique MD 05/25/2024 05:42 PM EST RP Workstation: [id number]     Procedures   Medications Ordered in the ED - No data to display  23 year old here for evaluation of chest pain she began a week ago.  Nonexertional.  Does have pleuritic  component as well as worse with palpation to the chest wall and movements such as twisting, pulling.  No known trauma or injury.  No abdominal pain.  Here she is afebrile, nonseptic, non-ill-appearing.  Her chest wall pain is reproducible on exam without overlying crepitus.  She is PERC negative, Wells criteria low risk.  No clinical evidence of VTE on exam.  No fever, cough, recent illnesses.  No history of IVDU.  No murmur on exam.  No family history of sudden cardiac death.  No palpitations.  Here she appears otherwise well.  Will plan on labs, imaging, reassess  Labs and imaging personally viewed and interpreted:  CBC without leukocytosis Metabolic panel without significant abnormality Lipase 34 Pregnancy test D-dimer 0.43 Troponin negative x 2 Chest x-ray without cardiomegaly, pulm edema, pneumothorax, infiltrates EKG wo ischemic changes  Reassessed.  We discussed labs and imaging.  Reassuring workup here in the emergency department.  Symptoms appear musculoskeletal in etiology.  At this time I have low suspicion for acute ACS, PE, dissection, unstable angina, myocarditis, pericarditis, endocarditis, Boerhaave rupture, pneumothorax, obstruction, perforated viscus, bacterial infectious process, traumatic injury, pericardial effusion, intra-abdominal process with radiation to the chest.  She does not want  anything for pain here in the emergency department.  Will have her follow-up outpatient, return for any worsening symptoms.  The patient has been appropriately medically screened and/or stabilized in the ED. I have low suspicion for any other emergent medical condition which would require further screening, evaluation or treatment in the ED or require inpatient management.  Patient is hemodynamically stable and in no acute distress.  Patient able to ambulate in department prior to ED.  Evaluation does not show acute pathology that would require ongoing or additional emergent interventions while in the emergency department or further inpatient treatment.  I have discussed the diagnosis with the patient and answered all questions.  Pain is been managed while in the emergency department and patient has no further complaints prior to discharge.  Patient is comfortable with plan discussed in room and is stable for discharge at this time.  I have discussed strict return precautions for returning to the emergency department.  Patient was encouraged to follow-up with PCP/specialist refer to at discharge.                                   Medical Decision Making Amount and/or Complexity of Data Reviewed External Data Reviewed: labs, radiology, ECG and notes. Labs: ordered. Decision-making details documented in ED Course. Radiology: ordered and independent interpretation performed. Decision-making details documented in ED Course. ECG/medicine tests: ordered and independent interpretation performed. Decision-making details documented in ED Course.  Risk OTC drugs. Decision regarding hospitalization. Diagnosis or treatment significantly limited by social determinants of health.        Final diagnoses:  Nonspecific chest pain    ED Discharge Orders     None          Christyne Mccain A, PA-C 05/25/24 2107    Doretha Folks, MD 05/28/24 [phone]
